# Patient Record
Sex: Female | Born: 1935 | Race: Black or African American | Hispanic: No | Smoking: Never smoker
Health system: Southern US, Community
[De-identification: ages and names within clinical notes are randomized; demographics above are authoritative.]

## PROBLEM LIST (undated history)

## (undated) DIAGNOSIS — M419 Scoliosis, unspecified: Secondary | ICD-10-CM

## (undated) DIAGNOSIS — E785 Hyperlipidemia, unspecified: Secondary | ICD-10-CM

## (undated) DIAGNOSIS — I10 Essential (primary) hypertension: Secondary | ICD-10-CM

## (undated) HISTORY — DX: Scoliosis, unspecified: M41.9

## (undated) HISTORY — DX: Hyperlipidemia, unspecified: E78.5

## (undated) HISTORY — DX: Essential (primary) hypertension: I10

---

## 2006-02-27 ENCOUNTER — Ambulatory Visit: Payer: Self-pay | Admitting: Pulmonary Disease

## 2006-03-06 ENCOUNTER — Ambulatory Visit: Payer: Self-pay | Admitting: Family Medicine

## 2006-03-20 ENCOUNTER — Ambulatory Visit: Payer: Self-pay | Admitting: Family Medicine

## 2006-05-15 ENCOUNTER — Emergency Department (HOSPITAL_COMMUNITY): Admission: EM | Admit: 2006-05-15 | Discharge: 2006-05-16 | Payer: Self-pay | Admitting: Emergency Medicine

## 2007-03-12 ENCOUNTER — Ambulatory Visit: Payer: Self-pay | Admitting: Family Medicine

## 2007-10-07 ENCOUNTER — Ambulatory Visit: Payer: Self-pay | Admitting: Family Medicine

## 2008-03-31 ENCOUNTER — Ambulatory Visit: Payer: Self-pay | Admitting: Family Medicine

## 2008-12-27 ENCOUNTER — Ambulatory Visit: Payer: Self-pay | Admitting: Family Medicine

## 2009-10-29 ENCOUNTER — Ambulatory Visit: Payer: Self-pay | Admitting: Oncology

## 2009-11-04 ENCOUNTER — Inpatient Hospital Stay (HOSPITAL_COMMUNITY): Admission: EM | Admit: 2009-11-04 | Discharge: 2009-11-05 | Payer: Self-pay | Admitting: Emergency Medicine

## 2009-11-06 ENCOUNTER — Ambulatory Visit: Payer: Self-pay | Admitting: Oncology

## 2009-11-08 LAB — CBC WITH DIFFERENTIAL/PLATELET
BASO%: 2.5 % — ABNORMAL HIGH (ref 0.0–2.0)
HCT: 29.2 % — ABNORMAL LOW (ref 34.8–46.6)
LYMPH%: 17.4 % (ref 14.0–49.7)
MCH: 36.4 pg — ABNORMAL HIGH (ref 25.1–34.0)
MCHC: 34.2 g/dL (ref 31.5–36.0)
MCV: 106.2 fL — ABNORMAL HIGH (ref 79.5–101.0)
MONO#: 0.7 10*3/uL (ref 0.1–0.9)
MONO%: 28.5 % — ABNORMAL HIGH (ref 0.0–14.0)
NEUT%: 51.2 % (ref 38.4–76.8)
Platelets: 74 10*3/uL — ABNORMAL LOW (ref 145–400)
RBC: 2.75 10*6/uL — ABNORMAL LOW (ref 3.70–5.45)
WBC: 2.4 10*3/uL — ABNORMAL LOW (ref 3.9–10.3)

## 2009-11-08 LAB — MORPHOLOGY: PLT EST: DECREASED

## 2009-11-09 ENCOUNTER — Ambulatory Visit: Payer: Self-pay | Admitting: Family Medicine

## 2009-11-09 LAB — ERYTHROPOIETIN: Erythropoietin: 11.5 m[IU]/mL (ref 2.6–34.0)

## 2009-11-22 ENCOUNTER — Ambulatory Visit: Payer: Self-pay | Admitting: Family Medicine

## 2009-12-06 ENCOUNTER — Ambulatory Visit: Payer: Self-pay | Admitting: Family Medicine

## 2009-12-17 ENCOUNTER — Ambulatory Visit: Payer: Self-pay | Admitting: Family Medicine

## 2010-01-07 ENCOUNTER — Ambulatory Visit: Payer: Self-pay | Admitting: Family Medicine

## 2010-01-29 ENCOUNTER — Ambulatory Visit: Payer: Self-pay | Admitting: Oncology

## 2010-01-31 LAB — CBC WITH DIFFERENTIAL/PLATELET
BASO%: 0.6 % (ref 0.0–2.0)
Basophils Absolute: 0 10*3/uL (ref 0.0–0.1)
HCT: 36.2 % (ref 34.8–46.6)
HGB: 12.3 g/dL (ref 11.6–15.9)
MONO#: 0.4 10*3/uL (ref 0.1–0.9)
NEUT#: 2.4 10*3/uL (ref 1.5–6.5)
NEUT%: 62.6 % (ref 38.4–76.8)
WBC: 3.8 10*3/uL — ABNORMAL LOW (ref 3.9–10.3)
lymph#: 0.9 10*3/uL (ref 0.9–3.3)

## 2010-02-26 ENCOUNTER — Ambulatory Visit: Payer: Self-pay | Admitting: Family Medicine

## 2010-03-14 ENCOUNTER — Ambulatory Visit: Payer: Self-pay | Admitting: Oncology

## 2010-04-16 ENCOUNTER — Ambulatory Visit: Payer: Self-pay | Admitting: Oncology

## 2010-04-18 LAB — COMPREHENSIVE METABOLIC PANEL
ALT: 16 U/L (ref 0–35)
AST: 21 U/L (ref 0–37)
Alkaline Phosphatase: 63 U/L (ref 39–117)
Calcium: 9.1 mg/dL (ref 8.4–10.5)
Chloride: 106 mEq/L (ref 96–112)
Creatinine, Ser: 0.69 mg/dL (ref 0.40–1.20)
Potassium: 4.4 mEq/L (ref 3.5–5.3)

## 2010-04-18 LAB — CBC WITH DIFFERENTIAL/PLATELET
BASO%: 0.2 % (ref 0.0–2.0)
Basophils Absolute: 0 10*3/uL (ref 0.0–0.1)
Eosinophils Absolute: 0.1 10*3/uL (ref 0.0–0.5)
HGB: 13.1 g/dL (ref 11.6–15.9)
MONO#: 0.5 10*3/uL (ref 0.1–0.9)
NEUT#: 2.6 10*3/uL (ref 1.5–6.5)
NEUT%: 60.9 % (ref 38.4–76.8)
RBC: 4.39 10*6/uL (ref 3.70–5.45)
RDW: 14.8 % — ABNORMAL HIGH (ref 11.2–14.5)
WBC: 4.3 10*3/uL (ref 3.9–10.3)
lymph#: 1 10*3/uL (ref 0.9–3.3)

## 2010-04-18 LAB — CHCC SMEAR

## 2010-08-06 ENCOUNTER — Ambulatory Visit (INDEPENDENT_AMBULATORY_CARE_PROVIDER_SITE_OTHER): Payer: Medicare Other | Admitting: Family Medicine

## 2010-08-06 DIAGNOSIS — B351 Tinea unguium: Secondary | ICD-10-CM

## 2010-08-22 ENCOUNTER — Ambulatory Visit (INDEPENDENT_AMBULATORY_CARE_PROVIDER_SITE_OTHER): Payer: Medicare Other | Admitting: Family Medicine

## 2010-08-22 DIAGNOSIS — L02619 Cutaneous abscess of unspecified foot: Secondary | ICD-10-CM

## 2010-08-22 DIAGNOSIS — L03039 Cellulitis of unspecified toe: Secondary | ICD-10-CM

## 2010-08-26 LAB — BASIC METABOLIC PANEL
BUN: 22 mg/dL (ref 6–23)
Calcium: 8.6 mg/dL (ref 8.4–10.5)
Chloride: 110 mEq/L (ref 96–112)
Creatinine, Ser: 0.8 mg/dL (ref 0.4–1.2)
GFR calc Af Amer: >60 mL/min (ref >60)

## 2010-08-26 LAB — FOLATE RBC: RBC Folate: 350 ng/mL (ref 180–600)

## 2010-08-26 LAB — PROTEIN ELECTROPH W RFLX QUANT IMMUNOGLOBULINS
Alpha-1-Globulin: 5 % — ABNORMAL HIGH (ref 2.9–4.9)
Alpha-2-Globulin: 7.5 % (ref 7.1–11.8)
Beta 2: 5.1 % (ref 3.2–6.5)
Gamma Globulin: 20.2 % — ABNORMAL HIGH (ref 11.1–18.8)

## 2010-08-26 LAB — COMPREHENSIVE METABOLIC PANEL
Albumin: 3.1 g/dL — ABNORMAL LOW (ref 3.5–5.2)
Alkaline Phosphatase: 63 U/L (ref 39–117)
BUN: 13 mg/dL (ref 6–23)
Calcium: 8 mg/dL — ABNORMAL LOW (ref 8.4–10.5)
Potassium: 3.5 mEq/L (ref 3.5–5.1)
Sodium: 140 mEq/L (ref 135–145)
Total Protein: 6 g/dL (ref 6.0–8.3)

## 2010-08-26 LAB — DIFFERENTIAL
Basophils Relative: 0 % (ref 0–1)
Eosinophils Relative: 0 % (ref 0–5)
Lymphocytes Relative: 33 % (ref 12–46)
Monocytes Relative: 3 % (ref 3–12)
Neutrophils Relative %: 64 % (ref 43–77)

## 2010-08-26 LAB — CULTURE, BLOOD (ROUTINE X 2)
Culture: NO GROWTH
Culture: NO GROWTH

## 2010-08-26 LAB — URINALYSIS, ROUTINE W REFLEX MICROSCOPIC
Bilirubin Urine: NEGATIVE
Hgb urine dipstick: NEGATIVE

## 2010-08-26 LAB — IMMUNOFIXATION ELECTROPHORESIS
IgG (Immunoglobin G), Serum: 1340 mg/dL (ref 694–1618)
IgM, Serum: 55 mg/dL — ABNORMAL LOW (ref 60–263)
Total Protein ELP: 5.7 g/dL — ABNORMAL LOW (ref 6.0–8.3)

## 2010-08-26 LAB — CROSSMATCH

## 2010-08-26 LAB — CBC
HCT: 19.6 % — ABNORMAL LOW (ref 36.0–46.0)
HCT: 30.8 % — ABNORMAL LOW (ref 36.0–46.0)
MCHC: 33.7 g/dL (ref 30.0–36.0)
MCV: 131.4 fL — ABNORMAL HIGH (ref 78.0–100.0)
Platelets: 108 10*3/uL — ABNORMAL LOW (ref 150–400)
Platelets: 81 10*3/uL — ABNORMAL LOW (ref 150–400)
RDW: 18.6 % — ABNORMAL HIGH (ref 11.5–15.5)
RDW: 30.6 % — ABNORMAL HIGH (ref 11.5–15.5)

## 2010-08-26 LAB — GLUCOSE, CAPILLARY: Glucose-Capillary: 108 mg/dL — ABNORMAL HIGH (ref 70–99)

## 2010-08-26 LAB — IRON AND TIBC
Saturation Ratios: 6 % — ABNORMAL LOW (ref 20–55)
TIBC: 186 ug/dL — ABNORMAL LOW (ref 250–470)

## 2010-08-26 LAB — IGG, IGA, IGM: IgG (Immunoglobin G), Serum: 1290 mg/dL (ref 694–1618)

## 2010-08-26 LAB — URINE CULTURE

## 2010-08-26 LAB — VITAMIN B12: Vitamin B-12: 100 pg/mL — ABNORMAL LOW (ref 211–911)

## 2010-08-26 LAB — IMMUNOFIXATION ADD-ON

## 2010-10-17 ENCOUNTER — Other Ambulatory Visit: Payer: Self-pay | Admitting: Oncology

## 2010-10-17 ENCOUNTER — Encounter (HOSPITAL_BASED_OUTPATIENT_CLINIC_OR_DEPARTMENT_OTHER): Payer: Medicare Other | Admitting: Oncology

## 2010-10-17 DIAGNOSIS — D61818 Other pancytopenia: Secondary | ICD-10-CM

## 2010-10-17 DIAGNOSIS — I10 Essential (primary) hypertension: Secondary | ICD-10-CM

## 2010-10-17 DIAGNOSIS — E538 Deficiency of other specified B group vitamins: Secondary | ICD-10-CM

## 2010-10-17 LAB — CBC WITH DIFFERENTIAL/PLATELET
Basophils Absolute: 0 10*3/uL (ref 0.0–0.1)
Eosinophils Absolute: 0.1 10*3/uL (ref 0.0–0.5)
HCT: 40.9 % (ref 34.8–46.6)
HGB: 13.8 g/dL (ref 11.6–15.9)
LYMPH%: 26.4 % (ref 14.0–49.7)
MCHC: 33.7 g/dL (ref 31.5–36.0)
MONO#: 0.4 10*3/uL (ref 0.1–0.9)
NEUT%: 62.4 % (ref 38.4–76.8)
Platelets: 174 10*3/uL (ref 145–400)
WBC: 4 10*3/uL (ref 3.9–10.3)
lymph#: 1.1 10*3/uL (ref 0.9–3.3)

## 2010-10-21 LAB — COMPREHENSIVE METABOLIC PANEL
BUN: 20 mg/dL (ref 6–23)
CO2: 22 mEq/L (ref 19–32)
Calcium: 9.1 mg/dL (ref 8.4–10.5)
Chloride: 105 mEq/L (ref 96–112)
Creatinine, Ser: 0.57 mg/dL (ref 0.40–1.20)
Glucose, Bld: 89 mg/dL (ref 70–99)
Total Bilirubin: 0.4 mg/dL (ref 0.3–1.2)

## 2010-10-21 LAB — VITAMIN B12: Vitamin B-12: 1004 pg/mL — ABNORMAL HIGH (ref 211–911)

## 2010-10-21 LAB — METHYLMALONIC ACID, SERUM: Methylmalonic Acid, Quantitative: 275 nmol/L (ref 87–318)

## 2010-10-25 NOTE — Assessment & Plan Note (Signed)
[facility]                               PULMONARY OFFICE NOTE   MALILLANY, KAZLAUSKAS                            MRN:          [id number]  DATE:02/26/2006                            DOB:          1915/10/19    HISTORY OF PRESENT ILLNESS:  Patient is a very pleasant 77 year old black  female who is being referred for an abnormal chest x-ray. Patient recently  had an increase in cough and went to go see Dr. Arthor Captain, where a chest a x-  ray revealed chronic changes/infiltration in the left upper lobe. Because of  this, the patient was referred for further evaluation. Patient states that  her cough is dry in nature and she has had no congestion or rattling and no  mucus. She has had no recent shortness of breath, fevers, chills or sweats.  She has had no weight loss and she has been eating quite well according to  the granddaughter. Patient does have a history of tuberculosis, diagnosed in  52 while living in [place]. Apparently, she took medications for a very  long period of time according to the granddaughter and was followed by the  health department. I will try to obtain these records. Patient states that  she does have a lot of rhinorrhea and post-nasal drip but she denies any  Gastroesophageal Reflux Disease symptoms. Patient has never smoked in the  past.   PAST MEDICAL HISTORY:  Significant for hypertension and history of  tuberculosis as stated above.   CURRENT MEDICATIONS:  Nifedical XL 30 mg daily.   ALLERGIES:  Patient has no known drug allergies.   SOCIAL HISTORY:  The patient is widowed and has children. She has never  smoked. She lives with her granddaughter, Oliva Bustard.   FAMILY HISTORY:  Remarkable for heart disease and cancer.   REVIEW OF SYSTEMS:  As per history of present illness. Also see the  patient's intake form documented on chart.   PHYSICAL EXAMINATION:  GENERAL: She is a well-developed, black female in no  acute  distress.  Blood pressure is 120/70, pulse 60, temperature is 97.4, weight 138 pounds,  02 saturation on room air is questionably 89%.  HEENT: Pupils equal, round, and reactive to light and accommodation.  Extraocular muscles are intact. Nares patent without discharge, oropharynx  clear.  NECK: Supple, without jugular vein distention or lymphadenopathy. No  palpable thyromegaly.  CHEST: Totally clear.  CARDIAC: Regular rate and rhythm with a 1/6 systolic murmur.  ABDOMEN: Soft, non-tender with good bowel sounds.  Genitalia exam, rectal exam, and breast exam was not done, and  not  indicated.  LOWER EXTREMITIES: Are without edema, good pulses distally.  NEUROLOGIC: She is alert and oriented and has no obvious, observable motor  defects.   LABORATORY DATA:  Chest x-ray from February 06, 2006, as per history of  present illness.   IMPRESSION:  Left upper lobe scarring and an infiltrate that is most  consistent with old tuberculosis. The patient has been treated for active  tuberculosis in 1995, and currently has absolutely no  symptoms consistent  with an active tuberculosis infection. I suspect this is old scarring. There  is no evidence to suggest an active bacterial infection either. The patient  has no constitutional symptoms and seems to be thriving, according to the  granddaughter. I have the told the patient and her granddaughter that I  cannot 100% exclude the possibility of malignancy but I think it is very  unlikely given her non-smoking history. Even if this did represent a  possible malignancy, there is really very little that I would I do at this  point in a frail 77 year old woman. There are agreeable with this.   PLAN:  1. Obtain records from [place] and the health department to make sure that      she was adequately treated for her tuberculosis.  2. The patient is to call me should she develop worsening constitutional      symptoms or other problems consistent with an  active infection.                                   Barbaraann Share, MD,FCCP   [facility]  DD:  02/27/2006  DT:  03/02/2006  Job #:  [id number]   cc:   Dr. Jannette Spanner

## 2010-11-29 ENCOUNTER — Encounter: Payer: Self-pay | Admitting: Medical

## 2010-11-29 ENCOUNTER — Telehealth: Payer: Self-pay | Admitting: Medical

## 2010-11-29 ENCOUNTER — Ambulatory Visit (INDEPENDENT_AMBULATORY_CARE_PROVIDER_SITE_OTHER): Payer: Medicare Other | Admitting: Medical

## 2010-11-29 ENCOUNTER — Ambulatory Visit
Admission: RE | Admit: 2010-11-29 | Discharge: 2010-11-29 | Disposition: A | Discharge status: Home / Self Care | Payer: Medicare Other | Source: Ambulatory Visit | Attending: Medical | Admitting: Medical

## 2010-11-29 VITALS — BP 140/72 | HR 80 | Ht 68.0 in | Wt 113.0 lb

## 2010-11-29 DIAGNOSIS — M76899 Other specified enthesopathies of unspecified lower limb, excluding foot: Secondary | ICD-10-CM

## 2010-11-29 DIAGNOSIS — M7071 Other bursitis of hip, right hip: Secondary | ICD-10-CM

## 2010-11-29 DIAGNOSIS — M25559 Pain in unspecified hip: Secondary | ICD-10-CM

## 2010-11-29 DIAGNOSIS — M25551 Pain in right hip: Secondary | ICD-10-CM

## 2010-11-29 IMAGING — CR DG HIP COMPLETE 2+V*R*
2 series · 2 of 2 positions shown · non-contrast
Comparison: None.

CLINICAL DATA: Right hip pain without trauma history.

RIGHT HIP - COMPLETE 2+ VIEW

[t hip ap right]
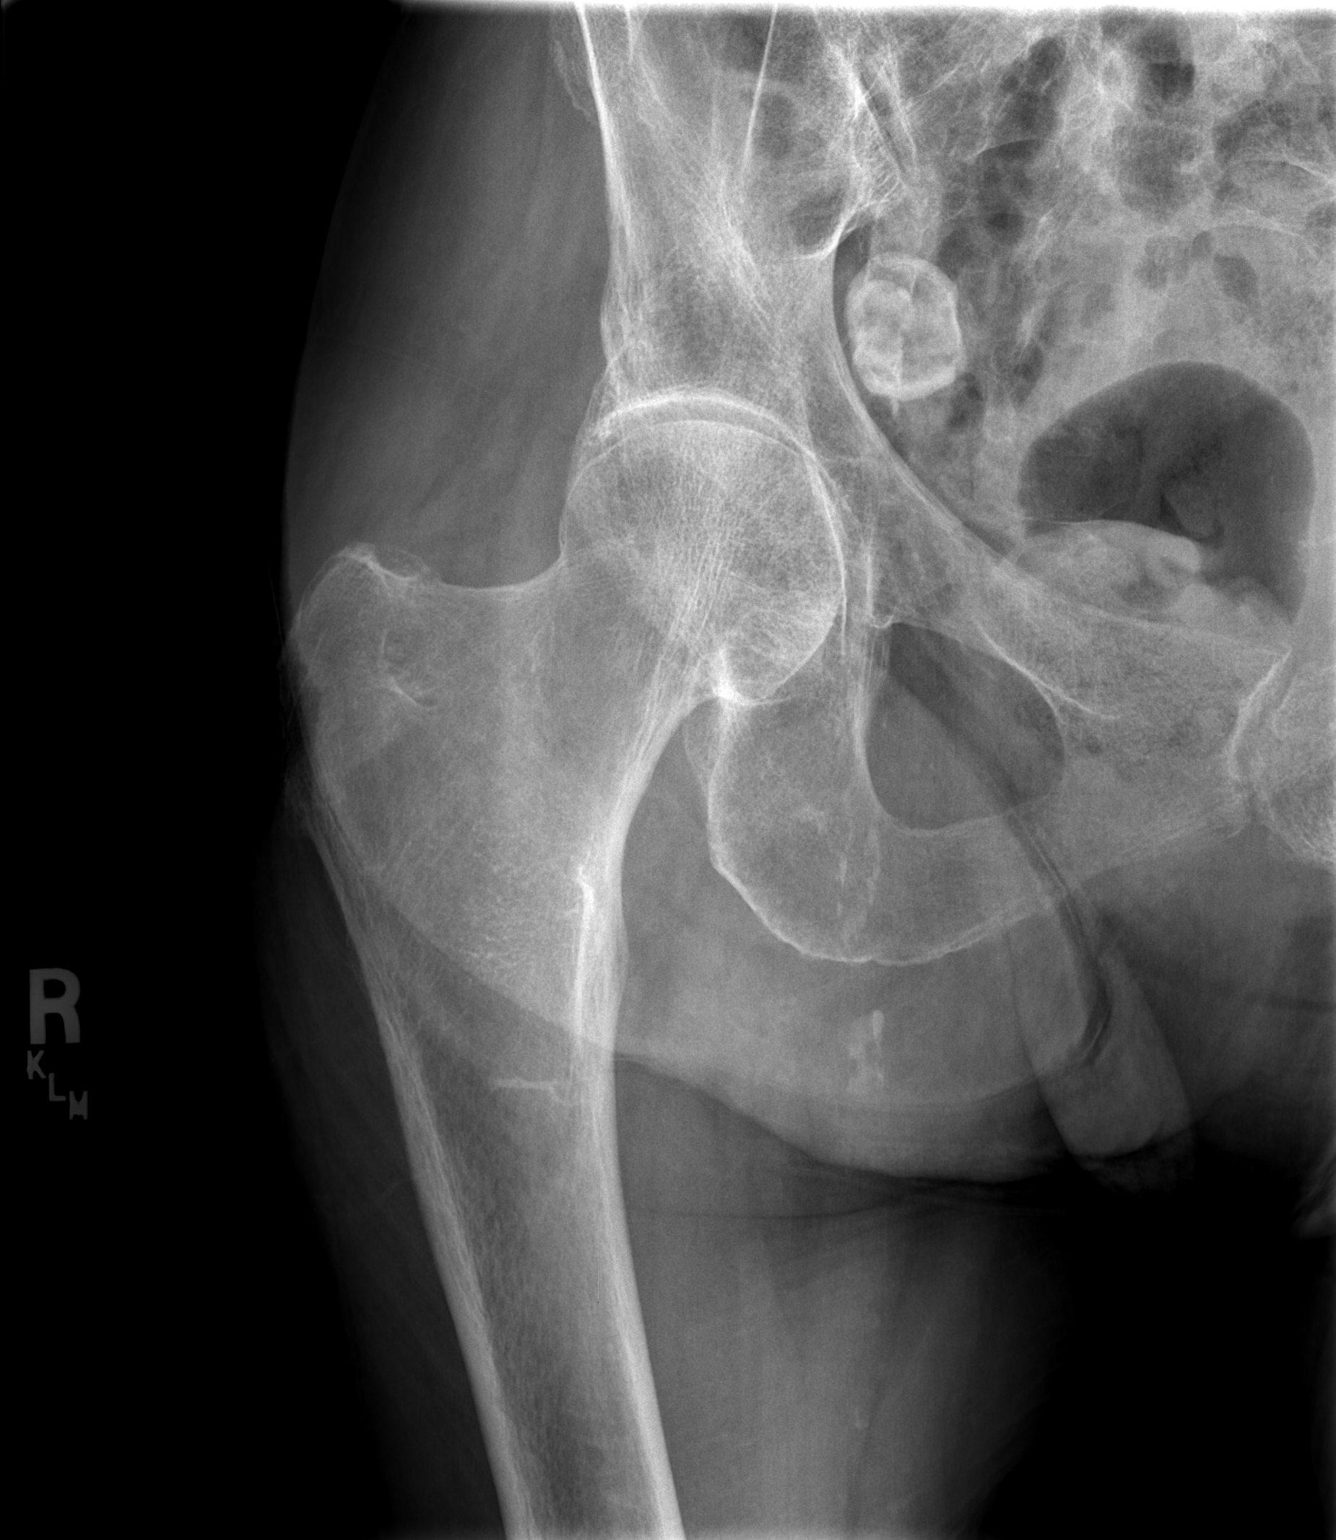

[t hip frog leg right]
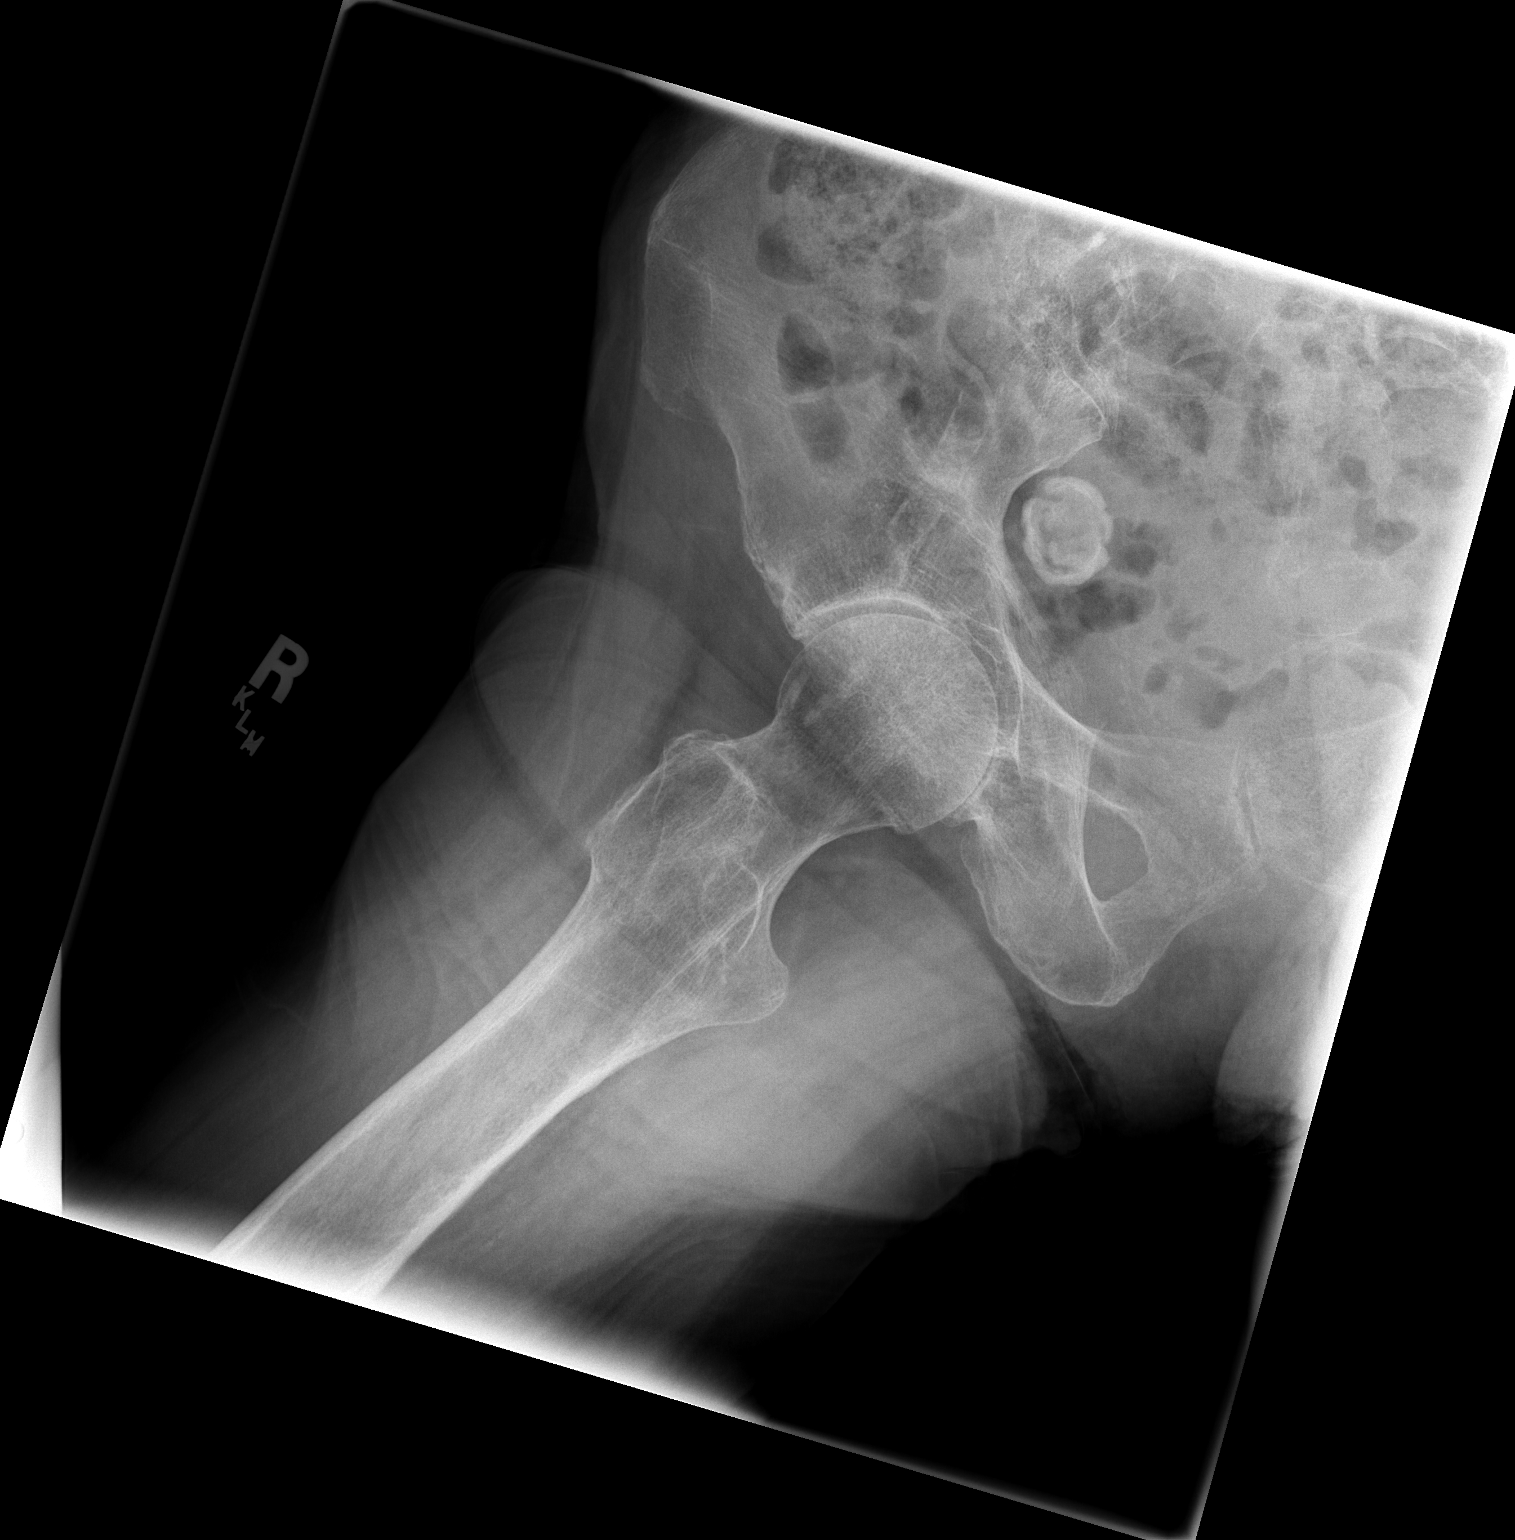

[2 of 2 positions shown; findings below may reference images not displayed]

FINDINGS: Diffuse osteopenia with no acute fracture, subluxation,
dislocation, osteonecrosis or stress fracture.

Marked degenerative joint space narrowing seen primarily medially
and inferiorly.

Slight to moderate peripheral arterial calcification with
approximate 3 cm right pelvic probable calcified uterine fibroid.
IMPRESSION: 1.  No inflammatory or acute abnormality.
2.  Diffuse osteopenia.
3.  Marked degenerative joint space narrowing at inferomedial right
hip with no complicating features.
4.  Slight to moderate peripheral arterial calcification with 3 cm
right pelvic probable calcified uterine fibroid.

## 2010-11-29 NOTE — Patient Instructions (Signed)
Recommended over-the-counter Tylenol arthritis 650 mg every 6 hours or 2 Aleve twice a day for short period time, ice, rest. We will call with x-ray results.

## 2010-11-29 NOTE — Telephone Encounter (Signed)
Advised grand dtr Selena Batten of pt's X-ray results as per Vincenza Hews & scheduled 2 week follow up-lm

## 2010-11-29 NOTE — Progress Notes (Signed)
Subjective:    Ashley Fisher is a 77 y.o. female who presents with right hip pain. Onset of the symptoms was several days ago. Inciting event: none. The patient reports the hip pain is worse with weight bearing, is aggravated by walking and Worse getting out of a chair. Aggravating symptoms include: going up and down stairs. Patient has had no prior hip problems. Previous visits for this problem: none. Evaluation to date: none. Treatment to date: none.  She did see her hematologist within the past month and her anemia is stable. No other new complaints, otherwise in her normal state of health. She lives with her granddaughter and her family and is supervised. She denies any fall or trauma.  The following portions of the patient's history were reviewed and updated as appropriate: allergies, current medications, past family history, past medical history, past social history, past surgical history and problem list.   Past Medical History  Diagnosis Date  . Hypertension   . Osteoporosis   . Scoliosis   . Dyslipidemia   . Vitamin D deficiency     Review of Systems A comprehensive review of systems was negative.   Objective:    BP 140/72  Pulse 80  Ht 5\' 8"  (1.727 m)  Wt 113 lb (51.256 kg)  BMI 17.18 kg/m2 General: No acute distress, African American female, tall and thin Skin: No erythema, no ecchymosis, no warmth, normal in the hip regions Neuro: Lower extremities normal strength and sensation, gait is slow but otherwise unremarkable Musculoskeletal: Tender over right greater trochanteric bursa, pain with external right hip range of motion, muscle mass in general is decreased throughout UE and LE Right hip: positives: pain with rotation and tenderness over greater trochanter  Left hip: full painless range of motion, without tenderness    Assessment:   Encounter Diagnoses  Name Primary?  . Hip pain, right Yes  . Bursitis of hip, right      Plan:   Etiology most likely right hip  bursitis.  We will send for x-ray.  Patient Instructions  Recommended over-the-counter Tylenol arthritis 650 mg every 6 hours or 2 Aleve twice a day for short period time, ice, rest. We will call with x-ray results.

## 2010-12-02 ENCOUNTER — Encounter: Payer: Self-pay | Admitting: Family Medicine

## 2010-12-02 DIAGNOSIS — M419 Scoliosis, unspecified: Secondary | ICD-10-CM | POA: Insufficient documentation

## 2010-12-02 DIAGNOSIS — I10 Essential (primary) hypertension: Secondary | ICD-10-CM | POA: Insufficient documentation

## 2010-12-02 DIAGNOSIS — M810 Age-related osteoporosis without current pathological fracture: Secondary | ICD-10-CM | POA: Insufficient documentation

## 2010-12-02 DIAGNOSIS — E538 Deficiency of other specified B group vitamins: Secondary | ICD-10-CM

## 2010-12-02 DIAGNOSIS — E785 Hyperlipidemia, unspecified: Secondary | ICD-10-CM | POA: Insufficient documentation

## 2010-12-12 ENCOUNTER — Ambulatory Visit: Payer: Medicare Other | Admitting: Medical

## 2011-02-09 ENCOUNTER — Other Ambulatory Visit: Payer: Self-pay | Admitting: Family Medicine

## 2011-02-12 ENCOUNTER — Other Ambulatory Visit: Payer: Self-pay | Admitting: Family Medicine

## 2011-03-14 ENCOUNTER — Other Ambulatory Visit: Payer: Self-pay | Admitting: Oncology

## 2011-03-14 ENCOUNTER — Encounter (HOSPITAL_BASED_OUTPATIENT_CLINIC_OR_DEPARTMENT_OTHER): Payer: Medicare Other | Admitting: Oncology

## 2011-03-14 DIAGNOSIS — E538 Deficiency of other specified B group vitamins: Secondary | ICD-10-CM

## 2011-03-14 DIAGNOSIS — Z23 Encounter for immunization: Secondary | ICD-10-CM

## 2011-03-14 DIAGNOSIS — D61818 Other pancytopenia: Secondary | ICD-10-CM

## 2011-03-14 LAB — CBC WITH DIFFERENTIAL/PLATELET
Basophils Absolute: 0 10*3/uL (ref 0.0–0.1)
EOS%: 0.8 % (ref 0.0–7.0)
Eosinophils Absolute: 0 10*3/uL (ref 0.0–0.5)
HGB: 12.8 g/dL (ref 11.6–15.9)
MCV: 89 fL (ref 79.5–101.0)
MONO%: 11.8 % (ref 0.0–14.0)
NEUT#: 2.6 10*3/uL (ref 1.5–6.5)
RBC: 4.2 10*6/uL (ref 3.70–5.45)
RDW: 14.9 % — ABNORMAL HIGH (ref 11.2–14.5)
lymph#: 0.9 10*3/uL (ref 0.9–3.3)

## 2011-03-14 LAB — COMPREHENSIVE METABOLIC PANEL
AST: 21 U/L (ref 0–37)
Alkaline Phosphatase: 66 U/L (ref 39–117)
BUN: 20 mg/dL (ref 6–23)
Chloride: 107 mEq/L (ref 96–112)
Glucose, Bld: 109 mg/dL — ABNORMAL HIGH (ref 70–99)
Potassium: 4.2 mEq/L (ref 3.5–5.3)
Sodium: 141 mEq/L (ref 135–145)
Total Protein: 6.9 g/dL (ref 6.0–8.3)

## 2011-03-14 LAB — VITAMIN B12: Vitamin B-12: >2000 pg/mL — ABNORMAL HIGH (ref 211–911)

## 2011-05-07 ENCOUNTER — Encounter: Payer: Self-pay | Admitting: Family Medicine

## 2011-05-07 ENCOUNTER — Ambulatory Visit (INDEPENDENT_AMBULATORY_CARE_PROVIDER_SITE_OTHER): Payer: Medicare Other | Admitting: Family Medicine

## 2011-05-07 VITALS — BP 120/74 | HR 84

## 2011-05-07 DIAGNOSIS — S9032XA Contusion of left foot, initial encounter: Secondary | ICD-10-CM

## 2011-05-07 DIAGNOSIS — S9030XA Contusion of unspecified foot, initial encounter: Secondary | ICD-10-CM

## 2011-05-07 NOTE — Progress Notes (Signed)
  Subjective:    Patient ID: Ashley Fisher, female    DOB: [birth year], 77 y.o.   MRN: [id number]  HPI She was found earlier today on the side of her bed. She has no particular complaints other than she has been noted to be limping slightly and unwilling to put weight on her left side.  Review of Systems     Objective:   Physical Exam Alert and in no distress. Good motion of the hip with no tenderness to palpation of the back, hip, femur, knee or tibia. She does have minimal swelling over the dorsum of the foot. X-ray shows no fracture but definite osteoporosis.      Assessment & Plan:  Foot contusion. Osteoporosis .Supportive care and call if further difficulty.

## 2011-12-12 ENCOUNTER — Ambulatory Visit (INDEPENDENT_AMBULATORY_CARE_PROVIDER_SITE_OTHER): Payer: Medicare Other | Admitting: Medical

## 2011-12-12 ENCOUNTER — Encounter: Payer: Self-pay | Admitting: Medical

## 2011-12-12 VITALS — BP 158/80 | HR 84 | Temp 98.2°F | Resp 16

## 2011-12-12 DIAGNOSIS — L84 Corns and callosities: Secondary | ICD-10-CM

## 2011-12-12 DIAGNOSIS — G478 Other sleep disorders: Secondary | ICD-10-CM

## 2011-12-12 DIAGNOSIS — F513 Sleepwalking [somnambulism]: Secondary | ICD-10-CM

## 2011-12-12 NOTE — Progress Notes (Signed)
Subjective:   HPI  Ashley Fisher is a 77 y.o. female who presents for sleepwalking concerns.  Here with granddaughter who is her caretaker.   Her granddaughter c/o that Ashley Fisher is sleepwalking.  Her granddaughter keeps a monitor in her room, and when she starts loud talking, she will go in and check on Ashley Fisher.  She has sleep walked 3-4 times in the past, but twice this week and worries that her sleep walking is increasing.  Both times this week she had gotten up sleepwalking out of the bed.  This time she had pulled the lamp out, was in the closet, and was talking about getting biscuits.   When granddaughter checks on AshleyFisher, her eyes are open and she says she is awake, but it takes some time to get Ashley Fisher back in the bed and to calm down.  She is strong and physical difficult to get back in the bed.  Sometimes she gets up to use the bathroom, but the sleep walking is occasional.  Granddaughter says her room is safe and she can't get out of her room, but she just wondered why this is happening.  She has a sleep routine and this is regular and no recent deviations to her bedtime or wake time.  She denies any recent illness, no URI, GI, or GU symptoms, no hallucinations, but she does report seeing other loved ones who have passed on, sees them at time.  Denies evening anxiety, fear.  Granddaughter says there is "nothing wrong with her mind."  Says she is a physical strong and mentally sharp 78yo. No other aggravating or relieving factors.    No other c/o.  The following portions of the patient's history were reviewed and updated as appropriate: allergies, current medications, past family history, past medical history, past social history, past surgical history and problem list.  Past Medical History  Diagnosis Date  . Hypertension   . Osteoporosis   . Scoliosis   . Dyslipidemia   . Vitamin d deficiency     No Known Allergies   Review of Systems ROS reviewed and was negative other than  noted in HPI or above.    Objective:   Physical Exam  General appearance: alert, no distress, WD/WN Oral cavity: MMM, no lesions Neck: supple, no lymphadenopathy, no thyromegaly, no masses Heart: RRR, normal S1, S2, no murmurs Lungs: CTA bilaterally, no wheezes, rhonchi, or rales Abdomen: +bs, soft, non tender, non distended, no masses, no hepatomegaly, no splenomegaly Pulses: 2+ symmetric Skin: left 4th toe with distal callous somewhat tender, lateral PIP of 4th toe with callous, otherwise non tender, several toenails thickened, but no obvious sores or ulcers Psych: pleasant, answers questions appropriately  Assessment and Plan :     Encounter Diagnoses  Name Primary?  . Sleep walking Yes  . Corns/callosities    Discussed concerns about sleep walking.  Discussed possible causes/reasons for this to be occurring.  Discussed patient safety in the home.  Discussed option of sleep study if things worsen or get more frequent.  Discussed signs/symptoms of dementia.  She doesn't seem to have anxiety issues, no depression currently, doing well in general.  Discussed sleep hygiene, discussed safety, fall prevention, discussed trial of children or low dose adult benadryl at bedtime for the next 1-2 wk to see if this helps.  Discussed other prescription medication, but there are risks to these medications in elderly such as fall risk and sedation.  If this continues to be a  problem or become more frequent, call or return.   Corns/callosities - epsom salt soaks, pumice stone and if not improving, let us know.  Can use donut pads for shoes.

## 2012-03-03 ENCOUNTER — Other Ambulatory Visit: Payer: Self-pay | Admitting: Family Medicine

## 2012-03-11 ENCOUNTER — Encounter: Payer: Medicare Other | Admitting: Family Medicine

## 2012-03-12 ENCOUNTER — Telehealth: Payer: Self-pay | Admitting: Family Medicine

## 2012-03-12 MED ORDER — NIFEDIPINE ER OSMOTIC RELEASE 30 MG PO TB24: 30.0000 mg | ORAL_TABLET | Freq: Every day | ORAL | Status: DC

## 2012-03-12 NOTE — Telephone Encounter (Signed)
Sent med in for 1 mth

## 2012-04-02 ENCOUNTER — Encounter: Payer: Self-pay | Admitting: Internal Medicine

## 2012-04-12 ENCOUNTER — Encounter: Payer: Medicare Other | Admitting: Family Medicine

## 2012-04-13 ENCOUNTER — Encounter: Payer: Self-pay | Admitting: Family Medicine

## 2012-04-13 ENCOUNTER — Ambulatory Visit (INDEPENDENT_AMBULATORY_CARE_PROVIDER_SITE_OTHER): Payer: Medicare Other | Admitting: Family Medicine

## 2012-04-13 VITALS — BP 130/82 | HR 68

## 2012-04-13 DIAGNOSIS — I10 Essential (primary) hypertension: Secondary | ICD-10-CM

## 2012-04-13 DIAGNOSIS — E538 Deficiency of other specified B group vitamins: Secondary | ICD-10-CM

## 2012-04-13 DIAGNOSIS — Z23 Encounter for immunization: Secondary | ICD-10-CM

## 2012-04-13 DIAGNOSIS — Z79899 Other long term (current) drug therapy: Secondary | ICD-10-CM

## 2012-04-13 MED ORDER — NIFEDIPINE ER OSMOTIC RELEASE 30 MG PO TB24: 30.0000 mg | ORAL_TABLET | Freq: Every day | ORAL | Status: DC

## 2012-04-13 MED ORDER — INFLUENZA VIRUS VACC SPLIT PF IM SUSP: 0.5000 mL | Freq: Once | INTRAMUSCULAR | Status: DC

## 2012-04-13 NOTE — Progress Notes (Signed)
  Subjective:    Patient ID: Ashley Fisher, female    DOB: 09-Aug-1915, 78 y.o.   MRN: [id number]  HPI She is here for a medication recheck. She has no particular concerns or complaints. She has been off vitamin B12 injections for approximately one year.   Review of Systems     Objective:   Physical Exam Alert and in no distress. Cardiac exam shows a grade 1 murmur. Lungs are clear to auscultation. Abdominal exam shows no masses.       Assessment & Plan:   1. HTN (hypertension)  Vitamin B12, NIFEdipine (NIFEDICAL XL) 30 MG 24 hr tablet  2. Vitamin B12 deficiency  CBC with Differential  3. Encounter for long-term (current) use of other medications  CBC with Differential, Vitamin B12  4. Need for prophylactic vaccination and inoculation against influenza  influenza  inactive virus vaccine (FLUZONE/FLUARIX) injection 0.5 mL   flu shot given with discussion of risks and benefits.

## 2012-04-14 LAB — CBC WITH DIFFERENTIAL/PLATELET
HCT: 37.2 % (ref 36.0–46.0)
Hemoglobin: 12.8 g/dL (ref 12.0–15.0)
Lymphocytes Relative: 34 % (ref 12–46)
Lymphs Abs: 1.5 10*3/uL (ref 0.7–4.0)
MCHC: 34.4 g/dL (ref 30.0–36.0)
Monocytes Absolute: 0.8 10*3/uL (ref 0.1–1.0)
Monocytes Relative: 17 % — ABNORMAL HIGH (ref 3–12)
Neutro Abs: 2 10*3/uL (ref 1.7–7.7)
RBC: 4.38 MIL/uL (ref 3.87–5.11)
WBC: 4.3 10*3/uL (ref 4.0–10.5)

## 2012-08-22 ENCOUNTER — Emergency Department (HOSPITAL_COMMUNITY)
Admission: EM | Admit: 2012-08-22 | Discharge: 2012-08-22 | Disposition: A | Discharge status: Home / Self Care | Payer: Medicare Other | Attending: Emergency Medicine | Admitting: Emergency Medicine

## 2012-08-22 ENCOUNTER — Encounter (HOSPITAL_COMMUNITY): Payer: Self-pay | Admitting: Emergency Medicine

## 2012-08-22 ENCOUNTER — Emergency Department (HOSPITAL_COMMUNITY): Payer: Medicare Other

## 2012-08-22 DIAGNOSIS — Z862 Personal history of diseases of the blood and blood-forming organs and certain disorders involving the immune mechanism: Secondary | ICD-10-CM | POA: Insufficient documentation

## 2012-08-22 DIAGNOSIS — M2550 Pain in unspecified joint: Secondary | ICD-10-CM | POA: Insufficient documentation

## 2012-08-22 DIAGNOSIS — IMO0001 Reserved for inherently not codable concepts without codable children: Secondary | ICD-10-CM | POA: Insufficient documentation

## 2012-08-22 DIAGNOSIS — R531 Weakness: Secondary | ICD-10-CM

## 2012-08-22 DIAGNOSIS — N390 Urinary tract infection, site not specified: Secondary | ICD-10-CM | POA: Insufficient documentation

## 2012-08-22 DIAGNOSIS — R011 Cardiac murmur, unspecified: Secondary | ICD-10-CM | POA: Insufficient documentation

## 2012-08-22 DIAGNOSIS — I10 Essential (primary) hypertension: Secondary | ICD-10-CM | POA: Insufficient documentation

## 2012-08-22 DIAGNOSIS — M4000 Postural kyphosis, site unspecified: Secondary | ICD-10-CM | POA: Insufficient documentation

## 2012-08-22 DIAGNOSIS — Z8639 Personal history of other endocrine, nutritional and metabolic disease: Secondary | ICD-10-CM | POA: Insufficient documentation

## 2012-08-22 DIAGNOSIS — R9431 Abnormal electrocardiogram [ECG] [EKG]: Secondary | ICD-10-CM | POA: Insufficient documentation

## 2012-08-22 DIAGNOSIS — Z8739 Personal history of other diseases of the musculoskeletal system and connective tissue: Secondary | ICD-10-CM | POA: Insufficient documentation

## 2012-08-22 DIAGNOSIS — R5381 Other malaise: Secondary | ICD-10-CM | POA: Insufficient documentation

## 2012-08-22 LAB — CBC WITH DIFFERENTIAL/PLATELET
Eosinophils Absolute: 0 10*3/uL (ref 0.0–0.7)
Eosinophils Relative: 0 % (ref 0–5)
Hemoglobin: 13.7 g/dL (ref 12.0–15.0)
Lymphocytes Relative: 11 % — ABNORMAL LOW (ref 12–46)
Lymphs Abs: 0.7 10*3/uL (ref 0.7–4.0)
MCH: 29.2 pg (ref 26.0–34.0)
MCV: 86.4 fL (ref 78.0–100.0)
Monocytes Relative: 8 % (ref 3–12)
Platelets: 211 10*3/uL (ref 150–400)
RBC: 4.69 MIL/uL (ref 3.87–5.11)
WBC: 6.1 10*3/uL (ref 4.0–10.5)

## 2012-08-22 LAB — COMPREHENSIVE METABOLIC PANEL
ALT: 12 U/L (ref 0–35)
Alkaline Phosphatase: 63 U/L (ref 39–117)
BUN: 29 mg/dL — ABNORMAL HIGH (ref 6–23)
CO2: 22 mEq/L (ref 19–32)
Calcium: 9 mg/dL (ref 8.4–10.5)
GFR calc Af Amer: 47 mL/min — ABNORMAL LOW (ref >90)
GFR calc non Af Amer: 41 mL/min — ABNORMAL LOW (ref >90)
Glucose, Bld: 184 mg/dL — ABNORMAL HIGH (ref 70–99)
Potassium: 3.5 mEq/L (ref 3.5–5.1)
Sodium: 137 mEq/L (ref 135–145)
Total Protein: 6.3 g/dL (ref 6.0–8.3)

## 2012-08-22 LAB — URINE MICROSCOPIC-ADD ON

## 2012-08-22 LAB — URINALYSIS, ROUTINE W REFLEX MICROSCOPIC
Bilirubin Urine: NEGATIVE
Ketones, ur: 15 mg/dL — AB
Nitrite: NEGATIVE
Protein, ur: 30 mg/dL — AB
Urobilinogen, UA: 0.2 mg/dL (ref 0.0–1.0)

## 2012-08-22 MED ORDER — SODIUM CHLORIDE 0.9 % IV SOLN
Freq: Once | INTRAVENOUS | Status: AC
  Administered 2012-08-22: 13:00:00 via INTRAVENOUS

## 2012-08-22 MED ORDER — SULFAMETHOXAZOLE-TRIMETHOPRIM 800-160 MG PO TABS: 1.0000 | ORAL_TABLET | Freq: Two times a day (BID) | ORAL | Status: DC

## 2012-08-22 NOTE — ED Notes (Signed)
Pt presenting to ed with c/o per granddaughter pt is more weak and sleeping a lot. Per granddaughter pt has the same symptoms as she did when her blood was low. Per granddaughter pt is very weak and congested.

## 2012-08-22 NOTE — ED Provider Notes (Signed)
History     CSN: [id number]  Arrival date & time 08/22/12  1115   First MD Initiated Contact with Patient 08/22/12 1158      Chief Complaint  Patient presents with  . Weakness    (Consider location/radiation/quality/duration/timing/severity/associated sxs/prior treatment) HPI Comments: Patient's granddaughter reports increasing generalized weakness for the past 2 weeks. She's been sleeping later in the day and not as mobile as usual. Denies any cough or fever. He denies any abdominal pain, nausea or vomiting. Granddaughter states she's had anemia in the past and she is worried she has again. No history of blood in the stool or vomiting blood. Patient is oriented x2 and denies complaint. Her only medication is nifedipine and her only medical problem is hypertension.  The history is provided by the patient and a relative. The history is limited by the condition of the patient.    Past Medical History  Diagnosis Date  . Hypertension   . Osteoporosis   . Scoliosis   . Dyslipidemia   . Vitamin D deficiency     History reviewed. No pertinent past surgical history.  Family History  Problem Relation Age of Onset  . Stroke Mother   . Cancer Sister     History  Substance Use Topics  . Smoking status: Never Smoker   . Smokeless tobacco: Never Used  . Alcohol Use: No    OB History   Grav Para Term Preterm Abortions TAB SAB Ect Mult Living                  Review of Systems  Constitutional: Positive for activity change, appetite change and fatigue. Negative for fever.  HENT: Negative for congestion and rhinorrhea.   Eyes: Negative for visual disturbance.  Respiratory: Negative for cough, chest tightness and shortness of breath.   Cardiovascular: Negative for chest pain.  Gastrointestinal: Negative for nausea, vomiting and abdominal pain.  Genitourinary: Negative for dysuria and hematuria.  Musculoskeletal: Positive for myalgias and arthralgias. Negative for back pain.   Neurological: Positive for weakness. Negative for dizziness, light-headedness and headaches.  A complete 10 system review of systems was obtained and all systems are negative except as noted in the HPI and PMH.    Allergies  Review of patient's allergies indicates no known allergies.  Home Medications   Current Outpatient Rx  Name  Route  Sig  Dispense  Refill  . NIFEdipine (NIFEDICAL XL) 30 MG 24 hr tablet   Oral   Take 1 tablet (30 mg total) by mouth daily.   90 tablet   3   . sulfamethoxazole-trimethoprim (SEPTRA DS) 800-160 MG per tablet   Oral   Take 1 tablet by mouth every 12 (twelve) hours.   14 tablet   0     BP 133/66  Pulse 89  Temp(Src) 97.3 F (36.3 C) (Oral)  Resp 16  SpO2 95%  Physical Exam  Constitutional: She appears well-developed and well-nourished.  HENT:  Head: Normocephalic and atraumatic.  Mouth/Throat: Oropharynx is clear and moist. No oropharyngeal exudate.  Eyes: Conjunctivae and EOM are normal. Pupils are equal, round, and reactive to light.  Neck: Normal range of motion. Neck supple.  Cardiovascular: Normal rate and regular rhythm.   Murmur heard. Irregular rhythm with loud precordial murmur  Pulmonary/Chest: Effort normal and breath sounds normal. No respiratory distress.  Abdominal: Soft. There is no tenderness. There is no rebound and no guarding.  Musculoskeletal: Normal range of motion. She exhibits no edema and no tenderness.  Kyphosis  Neurological: No cranial nerve deficit. She exhibits normal muscle tone. Coordination normal.  Skin: Skin is warm.    ED Course  Procedures (including critical care time)  Labs Reviewed  CBC WITH DIFFERENTIAL - Abnormal; Notable for the following:    Neutrophils Relative 81 (*)    Lymphocytes Relative 11 (*)    All other components within normal limits  COMPREHENSIVE METABOLIC PANEL - Abnormal; Notable for the following:    Glucose, Bld 184 (*)    BUN 29 (*)    Creatinine, Ser 1.12 (*)     Albumin 3.0 (*)    GFR calc non Af Amer 41 (*)    GFR calc Af Amer 47 (*)    All other components within normal limits  URINALYSIS, ROUTINE W REFLEX MICROSCOPIC - Abnormal; Notable for the following:    APPearance TURBID (*)    Hgb urine dipstick SMALL (*)    Ketones, ur 15 (*)    Protein, ur 30 (*)    Leukocytes, UA MODERATE (*)    All other components within normal limits  PROTIME-INR - Abnormal; Notable for the following:    Prothrombin Time 15.5 (*)    All other components within normal limits  URINE MICROSCOPIC-ADD ON - Abnormal; Notable for the following:    Bacteria, UA MANY (*)    All other components within normal limits  URINE CULTURE  TROPONIN I  OCCULT BLOOD, POC DEVICE   Dg Chest 2 View  08/22/2012  *RADIOLOGY REPORT*  Clinical Data: Shortness of breath and weakness.  CHEST - 2 VIEW  Comparison: 11/04/2009  Findings: Upper limits normal heart size noted.  Bilateral upper lung scarring again noted. Tiny bilateral pleural effusions are noted. There is no definite airspace disease, pneumothorax or new pulmonary opacity. No acute bony abnormalities identified.  IMPRESSION: New tiny bilateral pleural effusions without other significant change.   Original Report Authenticated By: Harmon Pier, M.D.      1. Weakness   2. EKG abnormality   3. UTI (urinary tract infection)       MDM  Progressive generalized weakness for the past several weeks with increased sleeping. No nausea, vomiting, fever, cough or chills. No chest pain or shortness of breath. No focal neuro deficits. Repeat EKG shows less prominent ST depressions inferolaterally. Mild Cr elevation, hemoglobin stable.  Ambulatory with walker and at baseline per granddaughter, tolerating PO. Treat UTI. Follow up with PCP regarding EKG abnormality. No CP or SOB. Troponin negative/    Date: 08/22/2012  Rate: 95  Rhythm: normal sinus rhythm  QRS Axis: normal  Intervals: normal  ST/T Wave abnormalities: ST  depressions inferiorly and ST depressions laterally  Conduction Disutrbances:none  Narrative Interpretation: A new ST depression inferior laterally  Old EKG Reviewed: changes noted     Date: 08/22/2012  Rate: 87  Rhythm: normal sinus rhythm  QRS Axis: normal  Intervals: normal  ST/T Wave abnormalities: ST depressions inferiorly and ST depressions laterally  Conduction Disutrbances:none  Narrative Interpretation: less prominent than previous  Old EKG Reviewed: changes noted    Glynn Octave, MD 08/22/12 1814

## 2012-08-23 LAB — URINE CULTURE

## 2012-08-24 ENCOUNTER — Telehealth (HOSPITAL_COMMUNITY): Payer: Self-pay | Admitting: Emergency Medicine

## 2012-08-24 NOTE — ED Notes (Signed)
Results received from [facility]. (+) URNC -> >/= 100,000 colonies, Viridans Strep.  Rx given in ED for Sulfa -Trimeth -> no sens. Provided  Chart to MD office for review.

## 2012-08-26 ENCOUNTER — Ambulatory Visit (INDEPENDENT_AMBULATORY_CARE_PROVIDER_SITE_OTHER): Payer: Medicare Other | Admitting: Family Medicine

## 2012-08-26 ENCOUNTER — Encounter: Payer: Self-pay | Admitting: Family Medicine

## 2012-08-26 DIAGNOSIS — R9431 Abnormal electrocardiogram [ECG] [EKG]: Secondary | ICD-10-CM

## 2012-08-26 DIAGNOSIS — N390 Urinary tract infection, site not specified: Secondary | ICD-10-CM

## 2012-08-26 NOTE — Progress Notes (Signed)
  Subjective:    Patient ID: Ashley Fisher, female    DOB: 02-26-1916, 79 y.o.   MRN: [id number]  HPI She was recently seen in the emergency room and evaluated for a two-week history of decreased physical functioning, weakness and fatigue. The emergency room record was reviewed and did show evidence of UTI as well as an abnormal EKG. She has had no chest pain, shortness of breath, DOE.   Review of Systems     Objective:   Physical Exam Alert and in no distress. Cardiac exam shows regular rhythm without murmurs or gallops. Lungs are clear to auscultation. Review of EKG from previous EKG of 2012 does show some potential ischemic changes.       Assessment & Plan:  UTI (lower urinary tract infection)  Abnormal EKG - Plan: Ambulatory referral to Cardiology recommend he return here in 10 days to 2 weeks to reevaluate her urine. Since she does have EKG changes, cardiology referral for possible intervention is appropriate. Discussed hospital catheterization and stenting with the family. She is certain not a candidate for surgical intervention.

## 2012-08-30 ENCOUNTER — Encounter: Payer: Self-pay | Admitting: Cardiology

## 2012-08-30 DIAGNOSIS — Z8611 Personal history of tuberculosis: Secondary | ICD-10-CM | POA: Insufficient documentation

## 2012-08-30 NOTE — Progress Notes (Unsigned)
Patient ID: YAJAIRA DOFFING, female   DOB: June 16, 1915, 79 y.o.   MRN: [id number]  [place], [place]    Date of visit:  08/30/2012 DOB:  [birth year]    Age:  79 yrs. Medical record number:  [id number]     Account number:  [id number] Primary Care Provider: Sharlot Gowda C ____________________________ CURRENT DIAGNOSES  1. Malaise and fatigue  2. Abnormal EKG  3. Hypertension,Essential (Benign)  4. Pernicious Anemia ____________________________ ALLERGIES  NKDA ____________________________ MEDICATIONS  1. nifedipine 30 mg tablet extended release 24hr, 1 p.o. daily ____________________________ CHIEF COMPLAINTS  Abd ekg ____________________________ HISTORY OF PRESENT ILLNESS  This very nice 79 year old black female is seen at the request of Dr. Susann Givens for evaluation of an abnormal EKG. The patient currently lives with a granddaughter and moved up here several years ago from [place],  [place]. She has never had a prior cardiac history. She has largely been healthy except for mild hypertension. She was admitted to the hospital with weakness and found to be pancytopenic and was eventually diagnosed with pernicious anemia. This evidently responded well to B12 injections. She will be 79 on her next birthday and has become progressively more feeble but is still quite active. She has been able to travel to the beach and has to use a walker to walk and sometimes uses a wheelchair. She denies any angina or shortness of breath. She does not have edema and states that she can do her normal activities around the house without limitation. She does not have PND, orthopnea or claudication.  She had a recent emergency room visit was found to have some ST depression laterally and also was found to have a urinary tract infection. I was asked to assess her because of the abnormal EKG.____________________________ PAST HISTORY  Past Medical Illnesses:  history of pernicious anemia, history of tuberculosis 1995, hypertension;   Cardiovascular Illnesses:  no previous history of cardiac disease.;  Surgical Procedures:  no prior surgical procedures;  Cardiology Procedures-Invasive:  no history of prior cardiac procedures;  Cardiology Procedures-Noninvasive:  no previous non-invasive procedures;  LVEF not documented ,  ____________________________ CARDIO-PULMONARY TEST DATES EKG Date:  08/30/2012;   ____________________________ FAMILY HISTORY Father -  deceased; Mother -  deceased; Brother 1 -  deceased; Brother 2 -  deceased; Brother 3 -  deceased; Sister 1 -  deceased;  ____________________________ SOCIAL HISTORY Alcohol Use:  no alcohol use;  Smoking:  never smoked;  Diet:  regular diet;  Lifestyle:  widowed;  Exercise:  no regular exercise;  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 Eyes: wears eye glasses/contact lenses, cataract extraction bilaterally Ears, Nose, Throat, Mouth:  denies any hearing loss, epistaxis, hoarseness or difficulty speaking. Respiratory: denies dyspnea, cough, wheezing or hemoptysis. Cardiovascular:  please review HPI Abdominal: denies dyspepsia, GI bleeding, constipation, or diarrheaGenitourinary-Female: recent urinary tract infection Musculoskeletal:  denies arthritis Neurological:  denies headaches, stroke, or TIA Psychiatric:  denies depession or anxiety.  ____________________________ PHYSICAL EXAMINATION VITAL SIGNS  Blood Pressure:  120/60 Sitting, Left arm, regular cuff  , 104/56 Standing, Left arm and regular cuff   Pulse:  88/min. Weight:  120.00 lbs. Height:  71"BMI: 17  Constitutional:  pleasant African American female in no acute distress, thin Skin:  warm and dry to touch, no apparent skin lesions or masses noted, some Head:  normocephalic, normal hair pattern, no masses or tenderness Eyes:  EOMS Intact, PERRLA, C and S  clear, Funduscopic exam not done. ENT:  ears, nose and  throat reveal no gross abnormalities.  Dentition good. Neck:  supple, without massess. No JVD, thyromegaly or carotid bruits. Carotid upstroke normal. Chest:  normal symmetry, clear to auscultation and percussion. Cardiac:  regular rhythm, normal S1 and S2, No S3 or S4, no murmurs, gallops or rubs detected. Abdomen:  abdomen soft,non-tender, no masses, no hepatospenomegaly, or aneurysm noted Peripheral Pulses:  the femoral,dorsalis pedis, and posterior tibial pulses are full and equal bilaterally with no bruits auscultated. Extremities & Back:  no deformities, clubbing, cyanosis, erythema or edema observed. Normal muscle strength and tone. Neurological:  not oriented to date, walks slowly with assistance ____________________________ IMPRESSIONS/PLAN  1. Abnormal EKG in a patient without evidence of clinical angina and who does not appear to be limited by dyspnea or other cardiac issues. 2. History of pulmonary tuberculosis treated in 1995 3. Hypertension 4. History of pernicious anemia 5. Significantly underweight  Recommendations:  I reviewed the EKG and she could possibly have an old anteroseptal infarction but does not have significant ST depression. It is also possible that she just has poor R-wave progression. At her age of almost 79, I would not suggest any further cardiac testing or workup at this time. By her history she did not appear to be limited and his not have symptomatology the think that we could address. I would be concerned about side effects of medication or potential complications of diagnostic testing in her. She appears to be remarkably well preserved although she could not tell me the date today. She is somewhat functionally limited at home. I would be happy to see her again should the need arise. ____________________________ TODAYS ORDERS  1. Return: prn  2. 12 Lead EKG: Today                       ____________________________ Cardiology Physician:  Darden Palmer MD [facility]

## 2012-10-20 ENCOUNTER — Emergency Department (HOSPITAL_COMMUNITY): Payer: Medicare Other

## 2012-10-20 ENCOUNTER — Encounter (HOSPITAL_COMMUNITY): Payer: Self-pay | Admitting: Radiology

## 2012-10-20 ENCOUNTER — Emergency Department (HOSPITAL_COMMUNITY)
Admission: EM | Admit: 2012-10-20 | Discharge: 2012-10-20 | Disposition: A | Discharge status: Home / Self Care | Payer: Medicare Other | Attending: Emergency Medicine | Admitting: Emergency Medicine

## 2012-10-20 DIAGNOSIS — Y939 Activity, unspecified: Secondary | ICD-10-CM | POA: Insufficient documentation

## 2012-10-20 DIAGNOSIS — M4120 Other idiopathic scoliosis, site unspecified: Secondary | ICD-10-CM | POA: Insufficient documentation

## 2012-10-20 DIAGNOSIS — E785 Hyperlipidemia, unspecified: Secondary | ICD-10-CM | POA: Insufficient documentation

## 2012-10-20 DIAGNOSIS — Z862 Personal history of diseases of the blood and blood-forming organs and certain disorders involving the immune mechanism: Secondary | ICD-10-CM | POA: Insufficient documentation

## 2012-10-20 DIAGNOSIS — I10 Essential (primary) hypertension: Secondary | ICD-10-CM | POA: Insufficient documentation

## 2012-10-20 DIAGNOSIS — W19XXXA Unspecified fall, initial encounter: Secondary | ICD-10-CM | POA: Insufficient documentation

## 2012-10-20 DIAGNOSIS — S0990XA Unspecified injury of head, initial encounter: Secondary | ICD-10-CM | POA: Insufficient documentation

## 2012-10-20 DIAGNOSIS — Y929 Unspecified place or not applicable: Secondary | ICD-10-CM | POA: Insufficient documentation

## 2012-10-20 DIAGNOSIS — N390 Urinary tract infection, site not specified: Secondary | ICD-10-CM

## 2012-10-20 DIAGNOSIS — M810 Age-related osteoporosis without current pathological fracture: Secondary | ICD-10-CM | POA: Insufficient documentation

## 2012-10-20 DIAGNOSIS — Z79899 Other long term (current) drug therapy: Secondary | ICD-10-CM | POA: Insufficient documentation

## 2012-10-20 LAB — URINALYSIS, ROUTINE W REFLEX MICROSCOPIC
Bilirubin Urine: NEGATIVE
Glucose, UA: NEGATIVE mg/dL
Ketones, ur: NEGATIVE mg/dL
Nitrite: POSITIVE — AB
Specific Gravity, Urine: 1.02 (ref 1.005–1.030)
pH: 5.5 (ref 5.0–8.0)

## 2012-10-20 LAB — URINE MICROSCOPIC-ADD ON

## 2012-10-20 MED ORDER — CEPHALEXIN 500 MG PO CAPS: 500.0000 mg | ORAL_CAPSULE | Freq: Four times a day (QID) | ORAL | Status: DC

## 2012-10-20 NOTE — ED Provider Notes (Signed)
History     CSN: [id number]  Arrival date & time 10/20/12  1240   None     Chief Complaint  Patient presents with  . Fall  . Head Laceration    (Consider location/radiation/quality/duration/timing/severity/associated sxs/prior treatment) HPI Comments: Patient had a mechanical fall today and struck her right posterior scalp causing a laceration. Patient is on no blood thinning medications. She currently denies pain, including in her neck and head. She was able to walk down a flight of stairs after the fall with assistance. Laceration initially bled however is now hemostatic. No chest pain or shortness of breath. Patient acting at her baseline per family member who is present. No blurry vision, vomiting, weakness or numbness in extremities. Onset acute. Course constant. Nothing makes symptoms better or worse.  The history is provided by the patient and a relative.    Past Medical History  Diagnosis Date  . Hypertension   . Osteoporosis   . Scoliosis   . Dyslipidemia   . Vitamin D deficiency     No past surgical history on file.  Family History  Problem Relation Age of Onset  . Stroke Mother   . Cancer Sister     History  Substance Use Topics  . Smoking status: Never Smoker   . Smokeless tobacco: Never Used  . Alcohol Use: No    OB History   Grav Para Term Preterm Abortions TAB SAB Ect Mult Living                  Review of Systems  Constitutional: Negative for fatigue.  HENT: Negative for neck pain and tinnitus.   Eyes: Negative for photophobia, pain and visual disturbance.  Respiratory: Negative for shortness of breath.   Cardiovascular: Negative for chest pain.  Gastrointestinal: Negative for nausea and vomiting.  Musculoskeletal: Negative for back pain and gait problem.  Skin: Positive for wound.  Neurological: Negative for dizziness, weakness, light-headedness, numbness and headaches.  Psychiatric/Behavioral: Negative for confusion and decreased  concentration.    Allergies  Review of patient's allergies indicates no known allergies.  Home Medications   Current Outpatient Rx  Name  Route  Sig  Dispense  Refill  . NIFEdipine (NIFEDICAL XL) 30 MG 24 hr tablet   Oral   Take 1 tablet (30 mg total) by mouth daily.   90 tablet   3     BP 157/79  Pulse 73  Temp(Src) 98.3 F (36.8 C) (Oral)  Resp 16  SpO2 100%  Physical Exam  Nursing note and vitals reviewed. Constitutional: She is oriented to person, place, and time. She appears well-developed and well-nourished.  HENT:  Head: Normocephalic and atraumatic. Head is without raccoon's eyes and without Battle's sign.  Right Ear: Tympanic membrane, external ear and ear canal normal. No hemotympanum.  Left Ear: Tympanic membrane, external ear and ear canal normal. No hemotympanum.  Nose: Nose normal. No nasal septal hematoma.  Mouth/Throat: Uvula is midline, oropharynx is clear and moist and mucous membranes are normal.  Eyes: Conjunctivae, EOM and lids are normal. Pupils are equal, round, and reactive to light. Right eye exhibits no nystagmus. Left eye exhibits no nystagmus.  No visible hyphema noted  Neck: Normal range of motion. Neck supple.  Cardiovascular: Normal rate and regular rhythm.   Pulmonary/Chest: Effort normal and breath sounds normal.  Abdominal: Soft. There is no tenderness.  Musculoskeletal:       Cervical back: She exhibits normal range of motion, no tenderness and no bony  tenderness.       Thoracic back: She exhibits no tenderness and no bony tenderness.       Lumbar back: She exhibits no tenderness and no bony tenderness.  Neurological: She is alert and oriented to person, place, and time. She has normal strength and normal reflexes. No cranial nerve deficit or sensory deficit. Coordination normal. GCS eye subscore is 4. GCS verbal subscore is 5. GCS motor subscore is 6.  Skin: Skin is warm and dry.  Small 4mm laceration R posterior scalp, linear,  hemostatic.  Psychiatric: She has a normal mood and affect.    ED Course  Procedures (including critical care time)  Labs Reviewed - No data to display Ct Head Wo Contrast  10/20/2012   *RADIOLOGY REPORT*  Clinical Data:  Fall.  Trauma to forehead.  CT HEAD WITHOUT CONTRAST CT CERVICAL SPINE WITHOUT CONTRAST  Technique:  Multidetector CT imaging of the head and cervical spine was performed following the standard protocol without intravenous contrast.  Multiplanar CT image reconstructions of the cervical spine were also generated.  Comparison:  CT head without contrast 11/04/2009.  CT HEAD  Findings: Atrophy and periventricular white matter disease has progressed.  The no acute cortical infarct, hemorrhage, or mass lesion is present.  The ventricles are proportionate to the degree of atrophy.  The paranasal sinuses and mastoid air cells are clear. Atherosclerotic calcifications are present in the cavernous carotid arteries.  No significant extracranial soft tissue injury is evident.  The osseous skull is intact.  IMPRESSION:  1.  Progressive atrophy and diffuse white matter disease. 2.  No acute abnormality.  CT CERVICAL SPINE  Findings: The cervical spine is imaged from skull base through T4. Exaggerated upper thoracic kyphosis and creates a very flat spine angle.  Moderate facet degenerative changes are present bilaterally.  Uncovertebral disease is greatest at C5-6.  The central canal is widely patent.  There is mild rightward curvature of the lower cervical spine.  Nodular peripheral airspace disease is present with some calcifications.  IMPRESSION:  1.  Moderate spondylosis of the cervical spine is most pronounced at C6-7. 2.  No acute fractures. 3.  Nodular disease at the apical pleural bilaterally.  While this may represent atelectasis, neoplasm cannot be excluded.  Recommend follow-up CT of the chest with contrast in 3 months.   Original Report Authenticated By: Marin Roberts, M.D.   Ct  Cervical Spine Wo Contrast  10/20/2012   *RADIOLOGY REPORT*  Clinical Data:  Fall.  Trauma to forehead.  CT HEAD WITHOUT CONTRAST CT CERVICAL SPINE WITHOUT CONTRAST  Technique:  Multidetector CT imaging of the head and cervical spine was performed following the standard protocol without intravenous contrast.  Multiplanar CT image reconstructions of the cervical spine were also generated.  Comparison:  CT head without contrast 11/04/2009.  CT HEAD  Findings: Atrophy and periventricular white matter disease has progressed.  The no acute cortical infarct, hemorrhage, or mass lesion is present.  The ventricles are proportionate to the degree of atrophy.  The paranasal sinuses and mastoid air cells are clear. Atherosclerotic calcifications are present in the cavernous carotid arteries.  No significant extracranial soft tissue injury is evident.  The osseous skull is intact.  IMPRESSION:  1.  Progressive atrophy and diffuse white matter disease. 2.  No acute abnormality.  CT CERVICAL SPINE  Findings: The cervical spine is imaged from skull base through T4. Exaggerated upper thoracic kyphosis and creates a very flat spine angle.  Moderate facet degenerative changes  are present bilaterally.  Uncovertebral disease is greatest at C5-6.  The central canal is widely patent.  There is mild rightward curvature of the lower cervical spine.  Nodular peripheral airspace disease is present with some calcifications.  IMPRESSION:  1.  Moderate spondylosis of the cervical spine is most pronounced at C6-7. 2.  No acute fractures. 3.  Nodular disease at the apical pleural bilaterally.  While this may represent atelectasis, neoplasm cannot be excluded.  Recommend follow-up CT of the chest with contrast in 3 months.   Original Report Authenticated By: Marin Roberts, M.D.     1. Head injury, initial encounter   2. Urinary tract infection     1:53 PM Patient seen and examined. Work-up initiated.   Vital signs reviewed and are  as follows: Filed Vitals:   10/20/12 1257  BP: 157/79  Pulse: 73  Temp: 98.3 F (36.8 C)  Resp: 16   CT head/neck findings reviewed. Patient discussed with and seen by Dr. Effie Shy. Will check UA prior to discharge.   UA showed UTI. Previous cx + viridans strep. Tx with keflex.   Patient and granddaughter informed. Urged PCP f/u in next week for recheck of urine.   Patient was counseled on head injury precautions and symptoms that should indicate their return to the ED.  These include severe worsening headache, vision changes, confusion, loss of consciousness, trouble walking, nausea & vomiting, or weakness/tingling in extremities.       MDM  Minor scalp lac not requiring repair.   Head/neck CT neg for acute findings.   UA shows UTI -- antibiotics given. No concern for sepsis. Pt appears well.   No CP, lightheadedness, palps prior to fall and patient did not lose consciousness. Suspect mechanical in nature. Patient/family reliable historians.         Renne Crigler, PA-C 10/20/12 [phone]

## 2012-10-20 NOTE — ED Provider Notes (Signed)
Ashley Fisher is a 79 y.o. female, who was at home when she fell. She was alone when she fell, but her daughter heard her fall. She got up with assistance after the fall. There are no reported injuries  Exam alert, elderly, calm. Head no pulp will or visible contusion. Neck appears supple. Extremities normal range of motion. No deformity.  Neurologic grossly nonfocal. She is responsive, but not communicative and.  Assessment fall without clear cause. No significant injury appreciated.   Medical screening examination/treatment/procedure(s) were conducted as a shared visit with non-physician practitioner(s) and myself.  I personally evaluated the patient during the encounter  Flint Melter, MD 10/20/12 Ernestina [place]

## 2012-10-20 NOTE — ED Notes (Signed)
Pt came in w/ granddaughter. Granddaughter states pt was walking to quickly and fell. Pt has no co pain. Small gouge noted on top rt scalp.

## 2012-10-22 LAB — URINE CULTURE: Colony Count: >=100000

## 2012-10-23 ENCOUNTER — Telehealth (HOSPITAL_COMMUNITY): Payer: Self-pay | Admitting: Emergency Medicine

## 2012-10-23 NOTE — ED Notes (Signed)
Post ED Visit - Positive Culture Follow-up  Culture report reviewed by antimicrobial stewardship pharmacist: []  Wes Dulaney, Pharm.D., BCPS []  Celedonio Miyamoto, [address].D., BCPS []  Georgina Pillion, [address].D., BCPS []  Schubert, [address].D., BCPS, AAHIVP []  Estella Husk, Pharm.D., BCPS, AAHIV [x]  Laurence Slate, [address].D.  Positive urine culture Treated with Keflex, organism sensitive to the same and no further patient follow-up is required at this time.  Kylie A Holland 10/23/2012, 6:19 PM

## 2012-11-10 ENCOUNTER — Ambulatory Visit (INDEPENDENT_AMBULATORY_CARE_PROVIDER_SITE_OTHER): Payer: Medicare Other | Admitting: Family Medicine

## 2012-11-10 DIAGNOSIS — Z87440 Personal history of urinary (tract) infections: Secondary | ICD-10-CM

## 2012-11-10 NOTE — Progress Notes (Signed)
  Subjective:    Patient ID: Ashley Fisher, female    DOB: May 03, 1916, 79 y.o.   MRN: [id number]  HPI She is here for recheck on her urine. She was unable to give [place] a specimen.   Review of Systems     Objective:   Physical Exam Alert and in no distress otherwise not examined       Assessment & Plan:  History of UTI I will have her caregiver bring a specimen by in the morning.

## 2012-11-11 ENCOUNTER — Other Ambulatory Visit (INDEPENDENT_AMBULATORY_CARE_PROVIDER_SITE_OTHER): Payer: Medicare Other

## 2012-11-11 DIAGNOSIS — R8290 Unspecified abnormal findings in urine: Secondary | ICD-10-CM

## 2012-11-11 DIAGNOSIS — R82998 Other abnormal findings in urine: Secondary | ICD-10-CM

## 2012-11-11 LAB — POCT URINALYSIS DIPSTICK
Bilirubin, UA: NEGATIVE
Blood, UA: 50
Ketones, UA: NEGATIVE
Spec Grav, UA: 1.015
pH, UA: 7

## 2012-11-11 NOTE — Progress Notes (Signed)
Quick Note:  Granddaughter said Ashley Fisher is not having any symptoms informed her sending off for culture and will let her know something as soon as we get results ______

## 2012-11-14 LAB — URINE CULTURE

## 2012-11-15 MED ORDER — AMOXICILLIN 875 MG PO TABS: 875.0000 mg | ORAL_TABLET | Freq: Two times a day (BID) | ORAL | Status: DC

## 2012-11-29 ENCOUNTER — Ambulatory Visit (INDEPENDENT_AMBULATORY_CARE_PROVIDER_SITE_OTHER): Payer: Medicare Other | Admitting: Family Medicine

## 2012-11-29 DIAGNOSIS — N390 Urinary tract infection, site not specified: Secondary | ICD-10-CM | POA: Diagnosis not present

## 2012-11-29 LAB — POCT URINALYSIS DIPSTICK
Bilirubin, UA: NEGATIVE
Leukocytes, UA: NEGATIVE
Nitrite, UA: NEGATIVE
Protein, UA: NEGATIVE
pH, UA: 6

## 2013-01-12 ENCOUNTER — Other Ambulatory Visit: Payer: Self-pay

## 2013-03-28 ENCOUNTER — Ambulatory Visit (INDEPENDENT_AMBULATORY_CARE_PROVIDER_SITE_OTHER): Payer: Medicare Other | Admitting: Family Medicine

## 2013-03-28 ENCOUNTER — Encounter: Payer: Self-pay | Admitting: Family Medicine

## 2013-03-28 VITALS — BP 110/70 | HR 68

## 2013-03-28 DIAGNOSIS — Z79899 Other long term (current) drug therapy: Secondary | ICD-10-CM

## 2013-03-28 DIAGNOSIS — E785 Hyperlipidemia, unspecified: Secondary | ICD-10-CM

## 2013-03-28 DIAGNOSIS — E559 Vitamin D deficiency, unspecified: Secondary | ICD-10-CM

## 2013-03-28 DIAGNOSIS — I10 Essential (primary) hypertension: Secondary | ICD-10-CM

## 2013-03-28 DIAGNOSIS — M810 Age-related osteoporosis without current pathological fracture: Secondary | ICD-10-CM

## 2013-03-28 DIAGNOSIS — Z23 Encounter for immunization: Secondary | ICD-10-CM

## 2013-03-28 DIAGNOSIS — N390 Urinary tract infection, site not specified: Secondary | ICD-10-CM

## 2013-03-28 LAB — COMPREHENSIVE METABOLIC PANEL
ALT: 12 U/L (ref 0–35)
AST: 20 U/L (ref 0–37)
Albumin: 4 g/dL (ref 3.5–5.2)
Alkaline Phosphatase: 66 U/L (ref 39–117)
Potassium: 3.9 mEq/L (ref 3.5–5.3)
Sodium: 139 mEq/L (ref 135–145)
Total Protein: 7 g/dL (ref 6.0–8.3)

## 2013-03-28 LAB — CBC WITH DIFFERENTIAL/PLATELET
Basophils Absolute: 0 10*3/uL (ref 0.0–0.1)
Basophils Relative: 1 % (ref 0–1)
Hemoglobin: 12.2 g/dL (ref 12.0–15.0)
Lymphocytes Relative: 33 % (ref 12–46)
MCHC: 33.6 g/dL (ref 30.0–36.0)
Neutro Abs: 2.3 10*3/uL (ref 1.7–7.7)
Neutrophils Relative %: 55 % (ref 43–77)
RDW: 15.4 % (ref 11.5–15.5)
WBC: 4.2 10*3/uL (ref 4.0–10.5)

## 2013-03-28 LAB — LIPID PANEL: LDL Cholesterol: 134 mg/dL — ABNORMAL HIGH (ref 0–99)

## 2013-03-28 NOTE — Progress Notes (Signed)
  Subjective:    Patient ID: Ashley Fisher, female    DOB: Aug 06, 1915, 79 y.o.   MRN: [id number]  HPI She is here for medication check. She is now 79 years old. She has no particular concerns or complaints. She is very astute and quick with the response. She still has a very good sense of humor. She continues on medications listed in the chart. She is here with her relatives. She does have a previous history of elevated lipids as well as osteoporosis. She does stay fairly active. She has a previous history of vitamin D deficiency. There is a question as to possible UTI due to abnormal urine smell. Review of Systems Negative except as above    Objective:   Physical Exam alert and in no distress. Tympanic membranes and canals are normal. Throat is clear. Tonsils are normal. Neck is supple without adenopathy or thyromegaly. Cardiac exam shows a regular sinus rhythm without murmurs or gallops. Lungs are clear to auscultation. Abdominal exam shows no masses or tenderness        Assessment & Plan:  HTN (hypertension) - Plan: CBC with Differential, Comprehensive metabolic panel  Dyslipidemia - Plan: Lipid panel  Unspecified vitamin D deficiency  Need for prophylactic vaccination and inoculation against influenza - Plan: Flu vaccine HIGH DOSE PF (Fluzone Tri High dose)  Encounter for long-term (current) use of other medications  Osteoporosis  I attempted to get a vitamin D level however I could not get it through the system to be paid for by Medicare. They will bring a urine specimen. Flu shot given with risks and benefits discussed

## 2013-03-29 ENCOUNTER — Other Ambulatory Visit (INDEPENDENT_AMBULATORY_CARE_PROVIDER_SITE_OTHER): Payer: Medicare Other

## 2013-03-29 DIAGNOSIS — R319 Hematuria, unspecified: Secondary | ICD-10-CM

## 2013-03-29 LAB — POCT URINALYSIS DIPSTICK
Bilirubin, UA: NEGATIVE
Glucose, UA: NEGATIVE
Nitrite, UA: POSITIVE
pH, UA: 5

## 2013-03-29 MED ORDER — SULFAMETHOXAZOLE-TMP DS 800-160 MG PO TABS: 1.0000 | ORAL_TABLET | Freq: Two times a day (BID) | ORAL | Status: DC

## 2013-03-29 NOTE — Addendum Note (Signed)
Addended by: Ronnald Nian on: 03/29/2013 12:59 PM   Modules accepted: Orders

## 2013-03-29 NOTE — Progress Notes (Signed)
  Subjective:    Patient ID: Ashley Fisher, female    DOB: 19-Jun-1915, 79 y.o.   MRN: [id number]  HPI    Review of Systems     Objective:   Physical Exam        Assessment & Plan:  The urinalysis dipstick and microscopic was positive. She has a previous history of UTI with Escherichia coli. I will treat her with Septra and have her return here in 2 weeks for recheck urinalysis.

## 2013-04-07 ENCOUNTER — Other Ambulatory Visit: Payer: Self-pay | Admitting: Family Medicine

## 2013-05-09 ENCOUNTER — Telehealth: Payer: Self-pay | Admitting: Family Medicine

## 2013-05-09 NOTE — Telephone Encounter (Signed)
A rrange to have home health to make a visit to assess her needs

## 2013-05-09 NOTE — Telephone Encounter (Signed)
Please call Ashley Fisher is having a lot of trouble getting up and down and she is wanting to check on getting a lift chair for her  But wanted to see if her insurances would help that

## 2013-05-09 NOTE — Telephone Encounter (Signed)
REFERRED TO Frances Furbish

## 2013-05-30 ENCOUNTER — Encounter: Payer: Self-pay | Admitting: Family Medicine

## 2013-05-30 ENCOUNTER — Ambulatory Visit (INDEPENDENT_AMBULATORY_CARE_PROVIDER_SITE_OTHER): Payer: Medicare Other | Admitting: Family Medicine

## 2013-05-30 VITALS — BP 150/80 | HR 80 | Temp 97.6°F | Ht 69.0 in | Wt 115.0 lb

## 2013-05-30 DIAGNOSIS — J069 Acute upper respiratory infection, unspecified: Secondary | ICD-10-CM

## 2013-05-30 NOTE — Patient Instructions (Signed)
Drink plenty of fluids. Use tylenol as needed for fever or pain. You can use Coricidin HBP or claritin or zyrtec as needed for nasal congestion You can use Mucinex or Robitussin (plain or DM) as needed for cough.  Return for re-evaluation if fever recurs, discolored mucus, headaches, dizziness, if any shortness of breath or worsening cough. Return if any confusion, mental status changes (seems lethargic, sleepy, difficult to arouse), dehydration or any other concerns.

## 2013-05-30 NOTE — Progress Notes (Signed)
Chief Complaint  Patient presents with  . Cough    and nasal congestion that is clear x 4 days. Always has a cough it has just worsened over the last 4 days. Granddaughter just wantd to make sure she doesn't have bronchitis or pneumonia because of her age. Did have a temp of 100.3 this past Friday, but no other days.    Started about 5 days ago with nasal congestion, hoarseness.  She denies sore throat, ear pain.  Nasal mucus is clear, and she has had a cough moreso than usual.  She has a chronic cough, at night, but is now coughing during the day.  Cough is nonproductive, only occasional.  She had a fever 3 nights ago, 100.3.  She hasn't had any fevers since.   She has taken a dose of children's benadryl, and one dose of a cold and sinus daytime medication (children's dose). Not sure if it helped at all.  She has been using Vick's vapo-rub, which may have helped some.  Past Medical History  Diagnosis Date  . Hypertension   . Osteoporosis   . Scoliosis   . Dyslipidemia   . Vitamin D deficiency    History reviewed. No pertinent past surgical history.  History   Social History  . Marital Status: Widowed    Spouse Name: N/A    Number of Children: N/A  . Years of Education: N/A   Occupational History  . Not on file.   Social History Main Topics  . Smoking status: Never Smoker   . Smokeless tobacco: Never Used  . Alcohol Use: No  . Drug Use: No  . Sexual Activity: Not on file   Other Topics Concern  . Not on file   Social History Narrative   Lives with her granddaughter, (and her husband), and 2 great-grandchildren. Grandson-in-law smokes in the garage.  No other tobacco exposure   Current outpatient prescriptions:NIFEDICAL XL 30 MG 24 hr tablet, TAKE 1 TABLET BY MOUTH DAILY, Disp: 90 tablet, Rfl: 3 Didn't take dose of BP med yet today (forgot, they were out of town).  No Known Allergies  ROS:  Denies headaches, dizziness, chest pain, shortness of breath, rashes, bleeding,  bruising, nausea, vomiting, diarrhea, myalgias or other complaints.  See HPI  PHYSICAL EXAM: BP 150/80  Pulse 80  Temp(Src) 97.6 F (36.4 C) (Oral)  Ht 5\' 9"  (1.753 m)  Wt 115 lb (52.164 kg)  BMI 16.97 kg/m2  Very pleasant, kyphotic, elderly female in a wheelchair.  Sitting comfortably, not coughing or sniffling.  Granddaughter is helping provide most of the history, but patient is confirming. HEENT:  PERRL, EOMI, conjunctiva clear.  TM's and EAC's normal.  OP clear, moist mucus membranes. Nasal mucosa is not erythematous, small amount of clear mucus is present in both nares.  Sinuses are nontender Neck:no lymphadenopathy Heart: regular rate and rhythm Lungs: clear with good air movement throughout.  No wheezes, rales, ronchi Skin: no rashes Psych: normal mood, affect, hygiene and grooming  ASSESSMENT/PLAN:  Acute upper respiratory infections of unspecified site  Drink plenty of fluids. Use tylenol as needed for fever or pain. You can use Coricidin HBP or claritin or zyrtec as needed for nasal congestion You can use Mucinex or Robitussin (plain or DM) as needed for cough.  Return for re-evaluation if fever recurs, discolored mucus, headaches, dizziness, if any shortness of breath or worsening cough. Return if any confusion, mental status changes (seems lethargic, sleepy, difficult to arouse), dehydration or any other  concerns.

## 2013-10-14 ENCOUNTER — Ambulatory Visit
Admission: RE | Admit: 2013-10-14 | Discharge: 2013-10-14 | Disposition: A | Discharge status: Home / Self Care | Payer: Medicare Other | Source: Ambulatory Visit | Attending: Family Medicine | Admitting: Family Medicine

## 2013-10-14 ENCOUNTER — Ambulatory Visit (INDEPENDENT_AMBULATORY_CARE_PROVIDER_SITE_OTHER): Payer: Medicare Other | Admitting: Family Medicine

## 2013-10-14 ENCOUNTER — Encounter: Payer: Self-pay | Admitting: Family Medicine

## 2013-10-14 VITALS — BP 116/72 | HR 80

## 2013-10-14 DIAGNOSIS — M79642 Pain in left hand: Secondary | ICD-10-CM

## 2013-10-14 DIAGNOSIS — M79609 Pain in unspecified limb: Secondary | ICD-10-CM

## 2013-10-14 NOTE — Progress Notes (Signed)
   Subjective:    Patient ID: Ashley Fisher, female    DOB: 04-08-1916, 80 y.o.   MRN: [id number]  HPI She complains of left fifth finger pain at the MCP joint. No history of recent injury. This is been going on for several weeks. She cannot fully extend her hand.  Review of Systems     Objective:   Physical Exam Minimal tenderness to palpation at the MCP joint however with attempts at extension, she complains of pain. Full motion of the other fingers without pain on motion or palpation.       Assessment & Plan:  Left hand pain - Plan: DG Hand 2 View Left

## 2013-11-22 ENCOUNTER — Telehealth: Payer: Self-pay

## 2013-11-22 NOTE — Telephone Encounter (Signed)
[name] (pt grandaughter) called stating that pt has had x-ray of hand and the pp that did the x ray told her that pt could go to a hand specialist if she wanted to. Were you aware of this and if so what needs to be done and where does she need to go next?

## 2013-11-22 NOTE — Telephone Encounter (Signed)
The x-ray did not show anything broken. If she is still having difficulty with the hand been seeing a specialist is okay

## 2013-11-23 NOTE — Telephone Encounter (Signed)
Information faxed over to [facility] and they will contact pt

## 2014-04-06 ENCOUNTER — Other Ambulatory Visit: Payer: Self-pay | Admitting: Family Medicine

## 2014-04-06 ENCOUNTER — Encounter: Payer: Self-pay | Admitting: Family Medicine

## 2014-04-06 ENCOUNTER — Ambulatory Visit (INDEPENDENT_AMBULATORY_CARE_PROVIDER_SITE_OTHER): Payer: Medicare Other | Admitting: Family Medicine

## 2014-04-06 VITALS — BP 110/70 | HR 66

## 2014-04-06 DIAGNOSIS — M810 Age-related osteoporosis without current pathological fracture: Secondary | ICD-10-CM

## 2014-04-06 DIAGNOSIS — E538 Deficiency of other specified B group vitamins: Secondary | ICD-10-CM

## 2014-04-06 DIAGNOSIS — Z23 Encounter for immunization: Secondary | ICD-10-CM

## 2014-04-06 DIAGNOSIS — I10 Essential (primary) hypertension: Secondary | ICD-10-CM

## 2014-04-06 MED ORDER — NIFEDIPINE ER OSMOTIC RELEASE 30 MG PO TB24: ORAL_TABLET | ORAL | Status: DC

## 2014-04-06 NOTE — Progress Notes (Signed)
   Subjective:    Patient ID: Ashley Fisher, female    DOB: [birth year], 80 y.o.   MRN: [id number]  HPI She is here for an interval evaluation. She continues to enjoy good health for 80 year old woman. She is only on her blood pressure medication. She does not take a multivitamin although she does have a previous history of vitamin B12 deficiency. She also has a history of osteoporosis. She has no particular concerns or complaints.   Review of Systems     Objective:   Physical Exam alert and in no distress. Tympanic membranes and canals are normal. Throat is clear. Tonsils are normal. Neck is supple without adenopathy or thyromegaly. Cardiac exam shows a regular sinus rhythm without murmurs or gallops. Lungs are clear to auscultation.        Assessment & Plan:  Essential hypertension - Plan: CBC with Differential, Comprehensive metabolic panel, NIFEdipine (NIFEDICAL XL) 30 MG 24 hr tablet  Need for prophylactic vaccination against Streptococcus pneumoniae (pneumococcus) - Plan: Pneumococcal conjugate vaccine 13-valent  Need for prophylactic vaccination and inoculation against influenza - Plan: Flu vaccine HIGH DOSE PF (Fluzone Tri High dose)  Osteoporosis - Plan: CBC with Differential, Comprehensive metabolic panel  Vitamin B12 deficiency - Plan: Vitamin B12, CBC with Differential, Comprehensive metabolic panel

## 2014-04-07 LAB — CBC WITH DIFFERENTIAL/PLATELET
BASOS ABS: 0 10*3/uL (ref 0.0–0.1)
BASOS PCT: 0 % (ref 0–1)
EOS PCT: 1 % (ref 0–5)
Eosinophils Absolute: 0 10*3/uL (ref 0.0–0.7)
HEMATOCRIT: 36.2 % (ref 36.0–46.0)
HEMOGLOBIN: 12 g/dL (ref 12.0–15.0)
LYMPHS PCT: 26 % (ref 12–46)
Lymphs Abs: 1 10*3/uL (ref 0.7–4.0)
MCH: 29.1 pg (ref 26.0–34.0)
MCHC: 33.1 g/dL (ref 30.0–36.0)
MCV: 87.7 fL (ref 78.0–100.0)
MONO ABS: 0.6 10*3/uL (ref 0.1–1.0)
MONOS PCT: 15 % — AB (ref 3–12)
NEUTROS ABS: 2.2 10*3/uL (ref 1.7–7.7)
Neutrophils Relative %: 58 % (ref 43–77)
Platelets: 217 10*3/uL (ref 150–400)
RBC: 4.13 MIL/uL (ref 3.87–5.11)
RDW: 15.1 % (ref 11.5–15.5)
WBC: 3.8 10*3/uL — ABNORMAL LOW (ref 4.0–10.5)

## 2014-04-07 LAB — COMPREHENSIVE METABOLIC PANEL
ALBUMIN: 3.6 g/dL (ref 3.5–5.2)
ALT: 12 U/L (ref 0–35)
AST: 19 U/L (ref 0–37)
Alkaline Phosphatase: 74 U/L (ref 39–117)
BUN: 16 mg/dL (ref 6–23)
CALCIUM: 8.6 mg/dL (ref 8.4–10.5)
CHLORIDE: 107 meq/L (ref 96–112)
CO2: 22 mEq/L (ref 19–32)
CREATININE: 0.69 mg/dL (ref 0.50–1.10)
GLUCOSE: 161 mg/dL — AB (ref 70–99)
POTASSIUM: 3.9 meq/L (ref 3.5–5.3)
Sodium: 140 mEq/L (ref 135–145)
Total Bilirubin: 0.4 mg/dL (ref 0.2–1.2)
Total Protein: 6.4 g/dL (ref 6.0–8.3)

## 2014-04-07 LAB — VITAMIN B12: VITAMIN B 12: 219 pg/mL (ref 211–911)

## 2014-04-07 LAB — HEMOGLOBIN A1C
HEMOGLOBIN A1C: 6 % — AB (ref <5.7)
MEAN PLASMA GLUCOSE: 126 mg/dL — AB (ref <117)

## 2014-07-17 ENCOUNTER — Ambulatory Visit (INDEPENDENT_AMBULATORY_CARE_PROVIDER_SITE_OTHER): Payer: Medicare Other | Admitting: Family Medicine

## 2014-07-17 ENCOUNTER — Other Ambulatory Visit: Payer: Self-pay

## 2014-07-17 ENCOUNTER — Encounter: Payer: Self-pay | Admitting: Family Medicine

## 2014-07-17 DIAGNOSIS — Z87448 Personal history of other diseases of urinary system: Secondary | ICD-10-CM

## 2014-07-17 DIAGNOSIS — Z87898 Personal history of other specified conditions: Secondary | ICD-10-CM

## 2014-07-17 DIAGNOSIS — R339 Retention of urine, unspecified: Secondary | ICD-10-CM | POA: Diagnosis not present

## 2014-07-17 LAB — POCT URINALYSIS DIPSTICK
Bilirubin, UA: NEGATIVE
Blood, UA: NEGATIVE
Glucose, UA: NEGATIVE
Ketones, UA: NEGATIVE
Leukocytes, UA: NEGATIVE
Nitrite, UA: NEGATIVE
Protein, UA: NEGATIVE
Spec Grav, UA: 1.025
Urobilinogen, UA: NEGATIVE
pH, UA: 6

## 2014-07-17 MED ORDER — INCONTINENCE BRIEF MEDIUM MISC: 1.0000 | Status: DC | PRN

## 2014-07-17 NOTE — Progress Notes (Signed)
   Subjective:    Patient ID: Ashley Fisher, female    DOB: 1915/06/17, 81 y.o.   MRN: [id number]  HPI She is brought in by her relatives for concerns of possible bladder infection. They do not think that she is urinating enough and are concerned about an infection. She does not complain of any urgency, dysuria or frequency.   Review of Systems     Objective:   Physical Exam Alert and in no distress. Abdominal exam shows no masses or tenderness. Urinalysis does show a specific gravity of 1.025 otherwise negative       Assessment & Plan:  Urine retention - Plan: POCT Urinalysis Dipstick  History of oliguria  recommend that she drink more fluids but and no intervention other than this is necessary

## 2014-08-08 ENCOUNTER — Other Ambulatory Visit: Payer: Self-pay

## 2014-08-08 ENCOUNTER — Telehealth: Payer: Self-pay | Admitting: Family Medicine

## 2014-08-08 NOTE — Telephone Encounter (Signed)
Pt's granddaughter, [name], called to find out where disposable diapers were sent and I told her the [facility]. She called right back stating that [facility] told her that they did not have the script on file and if they did they would not be able to file it to her insurance. [name] requesting that this be sent to a place that will accept pt's insurance.

## 2014-08-08 NOTE — Telephone Encounter (Signed)
Informed Ashley Fisher that she needed to call and talk with someone from medicare to see if they will cover supplies she said she would contact the case worker and go from there

## 2014-10-10 ENCOUNTER — Telehealth: Payer: Self-pay | Admitting: Family Medicine

## 2014-10-10 NOTE — Telephone Encounter (Signed)
Have her bring in a urine specimen

## 2014-10-10 NOTE — Telephone Encounter (Signed)
Told daughter kim that she should come by and bring urine in

## 2014-10-10 NOTE — Telephone Encounter (Signed)
Daughter [name] called states thinks pt has UTI, or something, states she won't urinate but maybe once a day, strong odor.  She is having mobility issues and very hard for her to get her in to doctor.  Can daughter just bring a urine specimen in for it to be checked?  Said next week she could have some help getting her here and could probably bring her in then but would like urine to be checked before then.

## 2014-10-11 ENCOUNTER — Other Ambulatory Visit (INDEPENDENT_AMBULATORY_CARE_PROVIDER_SITE_OTHER): Payer: Medicare Other

## 2014-10-11 DIAGNOSIS — R339 Retention of urine, unspecified: Secondary | ICD-10-CM

## 2014-10-11 LAB — POCT URINALYSIS DIPSTICK
BILIRUBIN UA: NEGATIVE
Blood, UA: NEGATIVE
GLUCOSE UA: NEGATIVE
Ketones, UA: NEGATIVE
NITRITE UA: NEGATIVE
Protein, UA: NEGATIVE
Spec Grav, UA: >=1.03
Urobilinogen, UA: NEGATIVE
pH, UA: 6

## 2014-10-24 NOTE — Progress Notes (Signed)
   Subjective:    Patient ID: Ashley Fisher, female    DOB: 02-21-1916, 81 y.o.   MRN: [id number]  HPI    Review of Systems     Objective:   Physical Exam        Assessment & Plan:  eror

## 2015-02-05 ENCOUNTER — Telehealth: Payer: Self-pay | Admitting: Family Medicine

## 2015-02-05 NOTE — Telephone Encounter (Signed)
Pt granddaughter come in and lost handicap placard and needs new completed put in your folder

## 2015-02-14 NOTE — Telephone Encounter (Signed)
Left message informing parking placard is ready.  °

## 2015-02-19 ENCOUNTER — Encounter: Payer: Self-pay | Admitting: Family Medicine

## 2015-02-19 ENCOUNTER — Ambulatory Visit (INDEPENDENT_AMBULATORY_CARE_PROVIDER_SITE_OTHER): Payer: Medicare Other | Admitting: Family Medicine

## 2015-02-19 VITALS — BP 120/60 | HR 68

## 2015-02-19 DIAGNOSIS — L539 Erythematous condition, unspecified: Secondary | ICD-10-CM | POA: Diagnosis not present

## 2015-02-19 DIAGNOSIS — Z23 Encounter for immunization: Secondary | ICD-10-CM | POA: Diagnosis not present

## 2015-02-19 NOTE — Addendum Note (Signed)
Addended by: Herminio Commons A on: 02/19/2015 05:11 PM   Modules accepted: Orders

## 2015-02-19 NOTE — Progress Notes (Signed)
   Subjective:    Patient ID: Ashley Fisher, female    DOB: 08-13-1915, 81 y.o.   MRN: [id number]  HPI She is brought here by her caregiver, her granddaughter, for complaint of pain patient flinching and stating it hurt when her granddaughter wiped her on Saturday. She only complained one time and has not complained of pain since then. Granddaughter states patient only voids 1 time per day usually and will void at night as well. The patient has a history of oliguria and urinary retention. She wears adult briefs. She is sitting in a wheelchair. She denies pain. Caregiver and patient deny fever, chills, abdominal pain, vomiting, diarrhea.   Reviewed allergies, medications, past medical history.   Review of Systems Pertinent positives and negatives in the history of present illness.    Objective:   Physical Exam  Constitutional: Vital signs are normal. She appears cachectic.  Non-toxic appearance.  Abdominal: Soft. Normal appearance. There is no tenderness.  Genitourinary:    There is no rash, tenderness or lesion on the right labia. There is no rash, tenderness or lesion on the left labia.  Neurological: She is alert.         Assessment & Plan:  Redness of skin  There is no evidence of infection. Showed granddaughter the area of mild redness and discussed that she will keep an eye on this and let me know if it becomes more red, swollen, or hard. Also discussed that if she continues to complain of pain when wiping her to let us know. Discussed looking for any signs of infection including fever, chills, change in mental status. Emphasized to the granddaughter that she is doing a good job keeping her clean and dry.

## 2015-02-19 NOTE — Patient Instructions (Signed)
Exam didn't show anything concerning for infection. Let us know if she develops a sore her skin becomes redder, or if she develops fever, chills or signs of infection.

## 2015-04-08 ENCOUNTER — Other Ambulatory Visit: Payer: Self-pay | Admitting: Family Medicine

## 2015-04-09 NOTE — Telephone Encounter (Signed)
Is this okay?

## 2015-08-17 ENCOUNTER — Encounter: Payer: Self-pay | Admitting: *Deleted

## 2015-11-13 ENCOUNTER — Ambulatory Visit: Payer: Self-pay | Admitting: Family Medicine

## 2015-12-25 ENCOUNTER — Ambulatory Visit (INDEPENDENT_AMBULATORY_CARE_PROVIDER_SITE_OTHER): Payer: Medicare Other | Admitting: Family Medicine

## 2015-12-25 ENCOUNTER — Telehealth: Payer: Self-pay

## 2015-12-25 ENCOUNTER — Encounter: Payer: Self-pay | Admitting: Family Medicine

## 2015-12-25 VITALS — BP 130/70 | HR 74

## 2015-12-25 DIAGNOSIS — N390 Urinary tract infection, site not specified: Secondary | ICD-10-CM | POA: Diagnosis not present

## 2015-12-25 DIAGNOSIS — H5000 Unspecified esotropia: Secondary | ICD-10-CM

## 2015-12-25 LAB — POCT URINALYSIS DIPSTICK
Bilirubin, UA: NEGATIVE
Glucose, UA: NEGATIVE
Ketones, UA: NEGATIVE
Leukocytes, UA: NEGATIVE
Nitrite, UA: POSITIVE
PH UA: 6
Protein, UA: NEGATIVE
RBC UA: POSITIVE
Spec Grav, UA: >=1.03
Urobilinogen, UA: NEGATIVE

## 2015-12-25 MED ORDER — SULFAMETHOXAZOLE-TRIMETHOPRIM 200-40 MG/5ML PO SUSP: ORAL | Status: DC

## 2015-12-25 NOTE — Progress Notes (Signed)
   Subjective:    Patient ID: Ashley Fisher, female    DOB: 1915-10-05, 82 y.o.   MRN: [id number]  HPI She is here with her granddaughter who states that she has been sleeping a lot more than normal. She also notes drainage that is clear mainly from the left eye. No fever, chills, abdominal pain. Mentally other than sleeping she seems be doing well.   Review of Systems     Objective:   Physical Exam Alert and in no distress. Cardiac exam shows regular rhythm without murmurs or gallops. Lungs are clear to auscultation. Urine dipstick was positive for nitrites. Microscopic did show contamination with occasional red cell.       Assessment & Plan:  UTI (lower urinary tract infection) - Plan: POCT Urinalysis Dipstick, sulfamethoxazole-trimethoprim (BACTRIM,SEPTRA) 200-40 MG/5ML suspension  Esotropia of left eye I will place her on an antibiotic to see if this will help. Explained that at her age anything can tip her over the edge in terms of her mental functioning. Discussed the eye findings and apparently at this time the will consider an eye evaluation.

## 2015-12-25 NOTE — Telephone Encounter (Signed)
Talked with [name] and she [facility]

## 2015-12-26 NOTE — Telephone Encounter (Signed)
Send her to ophthalmology

## 2015-12-28 ENCOUNTER — Other Ambulatory Visit: Payer: Self-pay

## 2015-12-28 DIAGNOSIS — H5000 Unspecified esotropia: Secondary | ICD-10-CM

## 2015-12-28 NOTE — Telephone Encounter (Signed)
[facility]

## 2016-04-02 ENCOUNTER — Other Ambulatory Visit: Payer: Self-pay | Admitting: Family Medicine

## 2016-05-08 ENCOUNTER — Inpatient Hospital Stay (HOSPITAL_COMMUNITY)
Admission: EM | Admit: 2016-05-08 | Discharge: 2016-05-10 | DRG: 177 | Disposition: A | Discharge status: Home / Self Care | Payer: Medicare Other | Attending: Family Medicine | Admitting: Family Medicine

## 2016-05-08 ENCOUNTER — Encounter (HOSPITAL_COMMUNITY): Payer: Self-pay | Admitting: Pharmacy Technician

## 2016-05-08 ENCOUNTER — Emergency Department (HOSPITAL_COMMUNITY): Payer: Medicare Other

## 2016-05-08 DIAGNOSIS — M419 Scoliosis, unspecified: Secondary | ICD-10-CM | POA: Diagnosis present

## 2016-05-08 DIAGNOSIS — E785 Hyperlipidemia, unspecified: Secondary | ICD-10-CM | POA: Diagnosis present

## 2016-05-08 DIAGNOSIS — R400 Somnolence: Secondary | ICD-10-CM | POA: Diagnosis present

## 2016-05-08 DIAGNOSIS — E43 Unspecified severe protein-calorie malnutrition: Secondary | ICD-10-CM | POA: Diagnosis not present

## 2016-05-08 DIAGNOSIS — J690 Pneumonitis due to inhalation of food and vomit: Secondary | ICD-10-CM | POA: Diagnosis not present

## 2016-05-08 DIAGNOSIS — M810 Age-related osteoporosis without current pathological fracture: Secondary | ICD-10-CM | POA: Diagnosis present

## 2016-05-08 DIAGNOSIS — Z66 Do not resuscitate: Secondary | ICD-10-CM | POA: Diagnosis present

## 2016-05-08 DIAGNOSIS — Z79899 Other long term (current) drug therapy: Secondary | ICD-10-CM

## 2016-05-08 DIAGNOSIS — J189 Pneumonia, unspecified organism: Secondary | ICD-10-CM | POA: Diagnosis present

## 2016-05-08 DIAGNOSIS — Z681 Body mass index (BMI) 19 or less, adult: Secondary | ICD-10-CM

## 2016-05-08 DIAGNOSIS — G9340 Encephalopathy, unspecified: Secondary | ICD-10-CM | POA: Diagnosis present

## 2016-05-08 DIAGNOSIS — E870 Hyperosmolality and hypernatremia: Secondary | ICD-10-CM | POA: Diagnosis present

## 2016-05-08 DIAGNOSIS — J181 Lobar pneumonia, unspecified organism: Secondary | ICD-10-CM

## 2016-05-08 DIAGNOSIS — I10 Essential (primary) hypertension: Secondary | ICD-10-CM | POA: Diagnosis not present

## 2016-05-08 DIAGNOSIS — E860 Dehydration: Secondary | ICD-10-CM | POA: Diagnosis present

## 2016-05-08 DIAGNOSIS — L89122 Pressure ulcer of left upper back, stage 2: Secondary | ICD-10-CM | POA: Diagnosis present

## 2016-05-08 DIAGNOSIS — L89213 Pressure ulcer of right hip, stage 3: Secondary | ICD-10-CM | POA: Diagnosis present

## 2016-05-08 DIAGNOSIS — L89209 Pressure ulcer of unspecified hip, unspecified stage: Secondary | ICD-10-CM | POA: Diagnosis present

## 2016-05-08 LAB — COMPREHENSIVE METABOLIC PANEL
ALK PHOS: 75 U/L (ref 38–126)
ALT: 30 U/L (ref 14–54)
ANION GAP: 9 (ref 5–15)
AST: 30 U/L (ref 15–41)
Albumin: 2.4 g/dL — ABNORMAL LOW (ref 3.5–5.0)
BILIRUBIN TOTAL: 0.7 mg/dL (ref 0.3–1.2)
BUN: 36 mg/dL — ABNORMAL HIGH (ref 6–20)
CALCIUM: 8.3 mg/dL — AB (ref 8.9–10.3)
CO2: 23 mmol/L (ref 22–32)
Chloride: 115 mmol/L — ABNORMAL HIGH (ref 101–111)
Creatinine, Ser: 0.8 mg/dL (ref 0.44–1.00)
GFR, EST NON AFRICAN AMERICAN: 59 mL/min — AB (ref >60)
Glucose, Bld: 146 mg/dL — ABNORMAL HIGH (ref 65–99)
Potassium: 3.3 mmol/L — ABNORMAL LOW (ref 3.5–5.1)
SODIUM: 147 mmol/L — AB (ref 135–145)
TOTAL PROTEIN: 6 g/dL — AB (ref 6.5–8.1)

## 2016-05-08 LAB — CBC WITH DIFFERENTIAL/PLATELET
BASOS ABS: 0 10*3/uL (ref 0.0–0.1)
BASOS PCT: 0 %
EOS ABS: 0 10*3/uL (ref 0.0–0.7)
Eosinophils Relative: 0 %
HEMATOCRIT: 37.7 % (ref 36.0–46.0)
HEMOGLOBIN: 12.4 g/dL (ref 12.0–15.0)
Lymphocytes Relative: 5 %
Lymphs Abs: 0.6 10*3/uL — ABNORMAL LOW (ref 0.7–4.0)
MCH: 29.2 pg (ref 26.0–34.0)
MCHC: 32.9 g/dL (ref 30.0–36.0)
MCV: 88.9 fL (ref 78.0–100.0)
Monocytes Absolute: 0.5 10*3/uL (ref 0.1–1.0)
Monocytes Relative: 4 %
NEUTROS ABS: 9.9 10*3/uL — AB (ref 1.7–7.7)
NEUTROS PCT: 91 %
Platelets: 146 10*3/uL — ABNORMAL LOW (ref 150–400)
RBC: 4.24 MIL/uL (ref 3.87–5.11)
RDW: 15.3 % (ref 11.5–15.5)
WBC: 11 10*3/uL — AB (ref 4.0–10.5)

## 2016-05-08 LAB — I-STAT CG4 LACTIC ACID, ED: LACTIC ACID, VENOUS: 2.76 mmol/L — AB (ref 0.5–1.9)

## 2016-05-08 LAB — CBG MONITORING, ED: GLUCOSE-CAPILLARY: 134 mg/dL — AB (ref 65–99)

## 2016-05-08 MED ORDER — DEXTROSE 5 % IV SOLN
1.0000 g | INTRAVENOUS | Status: DC
  Administered 2016-05-08: 1 g via INTRAVENOUS
  Filled 2016-05-08 (×2): qty 10

## 2016-05-08 MED ORDER — SODIUM CHLORIDE 0.9 % IV BOLUS (SEPSIS)
1000.0000 mL | Freq: Once | INTRAVENOUS | Status: AC
  Administered 2016-05-08: 1000 mL via INTRAVENOUS

## 2016-05-08 MED ORDER — DEXTROSE 5 % IV SOLN
500.0000 mg | INTRAVENOUS | Status: DC
  Filled 2016-05-08: qty 500

## 2016-05-08 MED ORDER — DEXTROSE 5 % IV SOLN
500.0000 mg | Freq: Once | INTRAVENOUS | Status: AC
  Administered 2016-05-08: 500 mg via INTRAVENOUS
  Filled 2016-05-08: qty 500

## 2016-05-08 MED ORDER — ENOXAPARIN SODIUM 30 MG/0.3ML ~~LOC~~ SOLN
30.0000 mg | SUBCUTANEOUS | Status: DC
  Administered 2016-05-09 – 2016-05-10 (×2): 30 mg via SUBCUTANEOUS
  Filled 2016-05-08 (×3): qty 0.3

## 2016-05-08 MED ORDER — ENOXAPARIN SODIUM 40 MG/0.4ML ~~LOC~~ SOLN: 40.0000 mg | SUBCUTANEOUS | Status: DC

## 2016-05-08 MED ORDER — SODIUM CHLORIDE 0.9 % IV SOLN
INTRAVENOUS | Status: DC
  Administered 2016-05-08 – 2016-05-09 (×2): via INTRAVENOUS

## 2016-05-08 NOTE — ED Notes (Signed)
Phlebotomy unsuccessful at obtaining blood cultures. This RN unsuccessful at blood draw. Dr Julian ReilGardner notified and ordered to go ahead and start antibiotics.

## 2016-05-08 NOTE — ED Notes (Signed)
Elevated CG-4 reported to Dr. Goldston 

## 2016-05-08 NOTE — ED Notes (Signed)
X RAY at bedside 

## 2016-05-08 NOTE — ED Provider Notes (Signed)
[facility]-EMERGENCY DEPT Provider Note   CSN: [id number] Arrival date & time: 05/08/16  1659  LEVEL 5 CAVEAT - ALTERED MENTAL STATUS   History   Chief Complaint Chief Complaint  Patient presents with  . Altered Mental Status    HPI Ashley Fisher is a 82 y.o. female.  HPI  82 year old female brought from home by EMS with altered mental status. History taken from the granddaughter who is her caretaker and power of attorney. Patient has been having congestion for about one week. No cough but has had increased respiratory rate to the point the family was concerned she might have pneumonia. Low-grade fevers up to 99. Vomited once today and has had decreased appetite over the last couple days with decreased mental status over the last couple days.  Past Medical History:  Diagnosis Date  . Dyslipidemia   . Hypertension   . Osteoporosis   . Scoliosis   . Vitamin D deficiency     Patient Active Problem List   Diagnosis Date Noted  . Acute encephalopathy 05/08/2016  . Right lower lobe pneumonia (HCC) 05/08/2016  . Decubitus ulcer of hip 05/08/2016  . CAP (community acquired pneumonia) 05/08/2016  . History of tuberculosis 08/30/2012  . Osteoporosis 12/02/2010  . HTN (hypertension) 12/02/2010  . Scoliosis 12/02/2010  . Dyslipidemia 12/02/2010  . Vitamin B12 deficiency 12/02/2010    History reviewed. No pertinent surgical history.  OB History    No data available       Home Medications    Prior to Admission medications   Medication Sig Start Date End Date Taking? Authorizing Provider  cefdinir (OMNICEF) 250 MG/5ML suspension Take 250 mg by mouth 2 (two) times daily. Started 11/26, for 10 days ending 12/5   Yes Historical Provider, MD  Cyanocobalamin (B-12 DOTS SL) Place 3 drops under the tongue once a week.    Yes Historical Provider, MD  NIFEdipine (PROCARDIA-XL/ADALAT CC) 30 MG 24 hr tablet take 1 tablet by mouth once daily 04/02/16  Yes Ronnald Nian, MD  SSD 1 % cream  Apply 1 application topically 2 (two) times daily. 05/04/16  Yes Historical Provider, MD  sulfamethoxazole-trimethoprim Soyla Dryer) 200-40 MG/5ML suspension 2 teaspoons twice per day 12/25/15   Ronnald Nian, MD    Family History Family History  Problem Relation Age of Onset  . Stroke Mother   . Cancer Sister     Social History Social History  Substance Use Topics  . Smoking status: Never Smoker  . Smokeless tobacco: Never Used  . Alcohol use No     Allergies   Patient has no known allergies.   Review of Systems Review of Systems  Unable to perform ROS: Patient nonverbal     Physical Exam Updated Vital Signs BP 144/75   Pulse 86   Temp 98.4 F (36.9 C) (Rectal)   Resp 23   SpO2 99%   Physical Exam  Constitutional:  Cachectic, frail  HENT:  Head: Normocephalic and atraumatic.  Right Ear: External ear normal.  Left Ear: External ear normal.  Nose: Nose normal.  Eyes: Right eye exhibits no discharge. Left eye exhibits no discharge.  Cardiovascular: Normal rate, regular rhythm and normal heart sounds.   Pulmonary/Chest: Effort normal and breath sounds normal.  Abdominal: Soft. She exhibits no distension. There is no tenderness.  Neurological: She is alert.  Patient is nonverbal and does not follow commands when asked area however she is awake and appears to be tracking.  Skin: Skin is warm  and dry.  Superficial appearing right greater trochanter wound. Superficial appearing abrasion/wound to the left shoulder Small wound over the right lateral ankle of unclear depth  Nursing note and vitals reviewed.    ED Treatments / Results  Labs (all labs ordered are listed, but only abnormal results are displayed) Labs Reviewed  COMPREHENSIVE METABOLIC PANEL - Abnormal; Notable for the following:       Result Value   Sodium 147 (*)    Potassium 3.3 (*)    Chloride 115 (*)    Glucose, Bld 146 (*)    BUN 36 (*)    Calcium 8.3 (*)    Total Protein 6.0 (*)      Albumin 2.4 (*)    GFR calc non Af Amer 59 (*)    All other components within normal limits  CBC WITH DIFFERENTIAL/PLATELET - Abnormal; Notable for the following:    WBC 11.0 (*)    Platelets 146 (*)    Neutro Abs 9.9 (*)    Lymphs Abs 0.6 (*)    All other components within normal limits  I-STAT CG4 LACTIC ACID, ED - Abnormal; Notable for the following:    Lactic Acid, Venous 2.76 (*)    All other components within normal limits  CBG MONITORING, ED - Abnormal; Notable for the following:    Glucose-Capillary 134 (*)    All other components within normal limits  CULTURE, BLOOD (ROUTINE X 2)  CULTURE, BLOOD (ROUTINE X 2)  CULTURE, BLOOD (ROUTINE X 2)  CULTURE, BLOOD (ROUTINE X 2)  CULTURE, EXPECTORATED SPUTUM-ASSESSMENT  GRAM STAIN  URINALYSIS, ROUTINE W REFLEX MICROSCOPIC (NOT AT [facility])  BASIC METABOLIC PANEL  HIV ANTIBODY (ROUTINE TESTING)  STREP PNEUMONIAE URINARY ANTIGEN    EKG  EKG Interpretation  Date/Time:  Thursday May 08 2016 17:13:22 EST Ventricular Rate:  92 PR Interval:    QRS Duration: 102 QT Interval:  389 QTC Calculation: 482 R Axis:   25 Text Interpretation:  Unknown rhythm, irregular rate Short PR interval LVH with secondary repolarization abnormality Anterior infarct, old Confirmed by Ashad Fawbush MD, Shilynn Hoch [phone]([id number]) on 05/08/2016 5:21:22 PM       Radiology Ct Head Wo Contrast  Result Date: 05/08/2016 CLINICAL DATA:  82 year old female with altered mental status. EXAM: CT HEAD WITHOUT CONTRAST TECHNIQUE: Contiguous axial images were obtained from the base of the skull through the vertex without intravenous contrast. COMPARISON:  10/20/2012 and prior CTs FINDINGS: Brain: No evidence of acute infarction, hemorrhage, hydrocephalus, extra-axial collection or mass lesion/mass effect. Atrophy and chronic small-vessel white matter ischemic changes are again noted. Vascular: Intracranial atherosclerotic calcifications are present. Skull: Normal. Negative for  fracture or focal lesion. Sinuses/Orbits: No acute finding. Other: None. IMPRESSION: No evidence of acute intracranial abnormality. Atrophy and chronic small-vessel white matter ischemic changes. Electronically Signed   By: Harmon PierJeffrey  Hu M.D.   On: 05/08/2016 19:31   Dg Chest Portable 1 View  Result Date: 05/08/2016 CLINICAL DATA:  Altered mental status. EXAM: PORTABLE CHEST 1 VIEW COMPARISON:  08/22/2012 . FINDINGS: Mediastinum and hilar structures are normal. Bilateral pleural-parenchymal thickening again noted consistent with scarring. Mild right infrahilar and right base infiltrate noted. Close follow-up chest x-rays to demonstrate clearing suggested. Stable cardiomegaly. No pleural effusion pneumothorax. IMPRESSION: 1. Biapical pleural parenchymal thickening consistent with scarring. 2. Right infrahilar and right lower lobe mild infiltrate. Close follow-up chest x-rays recommended demonstrate clearing. 3. Cardiomegaly. Electronically Signed   By: Maisie Fushomas  Register   On: 05/08/2016 17:45    Procedures Procedures (  including critical care time)  Medications Ordered in ED Medications  azithromycin (ZITHROMAX) 500 mg in dextrose 5 % 250 mL IVPB (500 mg Intravenous New Bag/Given 05/08/16 2118)  cefTRIAXone (ROCEPHIN) 1 g in dextrose 5 % 50 mL IVPB (1 g Intravenous New Bag/Given 05/08/16 2029)  0.9 %  sodium chloride infusion ( Intravenous New Bag/Given 05/08/16 2029)  azithromycin (ZITHROMAX) 500 mg in dextrose 5 % 250 mL IVPB (not administered)  enoxaparin (LOVENOX) injection 40 mg (not administered)  sodium chloride 0.9 % bolus 1,000 mL (1,000 mLs Intravenous New Bag/Given 05/08/16 2000)     Initial Impression / Assessment and Plan / ED Course  I have reviewed the triage vital signs and the nursing notes.  Pertinent labs & imaging results that were available during my care of the patient were reviewed by me and considered in my medical decision making (see chart for details).  Clinical  Course     Patient found to have pneumonia. This is likely the cause of her change in mental status. Discussed with granddaughter who is POA, patient is a DO NOT RESUSCITATE. They would like fluids and antibiotics. At this point patient is stable except for depressed mental status but is currently protecting her airway. IV antibiotics and admit to the hospitalist.  Final Clinical Impressions(s) / ED Diagnoses   Final diagnoses:  Somnolence  Community acquired pneumonia of right lung, unspecified part of lung    New Prescriptions New Prescriptions   No medications on file     Pricilla LovelessScott Harriet Bollen, MD 05/08/16 2126

## 2016-05-08 NOTE — ED Notes (Signed)
This RN unsuccessful at drawing blood cultures. Phlebotomy to collect.

## 2016-05-08 NOTE — Progress Notes (Signed)
Pharmacy Antibiotic Note  Ashley Fisher is a 82 y.o. female admitted on 05/08/2016 with pneumonia.  Pharmacy has been consulted for ceftriaxone dosing.  Plan: Start ceftriaxone 1g IV Q24 Monitor clinical picture  Rx will sign off as no renal adjustments necessary     Temp (24hrs), Avg:98.4 F (36.9 C), Min:98.4 F (36.9 C), Max:98.4 F (36.9 C)  No results for input(s): WBC, CREATININE, LATICACIDVEN, VANCOTROUGH, VANCOPEAK, VANCORANDOM, GENTTROUGH, GENTPEAK, GENTRANDOM, TOBRATROUGH, TOBRAPEAK, TOBRARND, AMIKACINPEAK, AMIKACINTROU, AMIKACIN in the last 168 hours.  CrCl cannot be calculated (Patient's most recent lab result is older than the maximum 21 days allowed.).    No Known Allergies   Thank you for allowing pharmacy to be a part of this patient's care.  Ashley StammerBATCHELDER,Ashley Fisher J 05/08/2016 6:04 PM

## 2016-05-08 NOTE — ED Notes (Signed)
Pt transported to CT ?

## 2016-05-08 NOTE — H&P (Addendum)
History and Physical    Ashley Fisher ZOX:[id number] DOB: Dec 01, 1915 DOA: 05/08/2016   PCP: Carollee HerterLALONDE,JOHN CHARLES, MD Chief Complaint:  Chief Complaint  Patient presents with  . Altered Mental Status    HPI: Ashley FosterMary S Fisher is a 82 y.o. female with medical history significant of HTN.  Patient brought in to ED by family members due to AMS.  Patient not speaking much today which is new for her.  Patient has been having congestion for about one week.  No cough but has increased RR.  Low grade fevers to 99.  Vomited once today and has decreased appetite over past couple of days as well as decreased mental status.  Symptoms worsening, persistent, nothing makes better or worse.  ED Course: RLL PNA on CXR.  WBC 11k, slightly dehydrated with sodium 147, BUN 36, cr 0.8, initial lactate 2.7.  Review of Systems: Unable to perform due to AMS.   Past Medical History:  Diagnosis Date  . Dyslipidemia   . Hypertension   . Osteoporosis   . Scoliosis   . Vitamin D deficiency     History reviewed. No pertinent surgical history.   reports that she has never smoked. She has never used smokeless tobacco. She reports that she does not drink alcohol or use drugs.  No Known Allergies  Family History  Problem Relation Age of Onset  . Stroke Mother   . Cancer Sister       Prior to Admission medications   Medication Sig Start Date End Date Taking? Authorizing Provider  cefdinir (OMNICEF) 250 MG/5ML suspension Take 250 mg by mouth 2 (two) times daily. Started 11/26, for 10 days ending 12/5   Yes Historical Provider, MD  Cyanocobalamin (B-12 DOTS SL) Place 3 drops under the tongue once a week.    Yes Historical Provider, MD  NIFEdipine (PROCARDIA-XL/ADALAT CC) 30 MG 24 hr tablet take 1 tablet by mouth once daily 04/02/16  Yes Ronnald NianJohn C Lalonde, MD  SSD 1 % cream Apply 1 application topically 2 (two) times daily. 05/04/16  Yes Historical Provider, MD  sulfamethoxazole-trimethoprim (BACTRIM,SEPTRA) 200-40  MG/5ML suspension 2 teaspoons twice per day 12/25/15   Ronnald NianJohn C Lalonde, MD    Physical Exam: Vitals:   05/08/16 1713 05/08/16 1813 05/08/16 1815 05/08/16 1828  BP: 174/93  153/78   Pulse: 90 91  89  Resp: (!) 28 17 17    Temp: 98.4 F (36.9 C)     TempSrc: Rectal     SpO2: 100% 99%  99%      Constitutional: NAD, calm, comfortable Eyes: PERRL, lids and conjunctivae normal ENMT: Mucous membranes are moist. Posterior pharynx clear of any exudate or lesions.Normal dentition.  Neck: normal, supple, no masses, no thyromegaly Respiratory: clear to auscultation bilaterally, no wheezing, no crackles. Normal respiratory effort. No accessory muscle use.  Cardiovascular: Regular rate and rhythm, no murmurs / rubs / gallops. No extremity edema. 2+ pedal pulses. No carotid bruits.  Abdomen: no tenderness, no masses palpated. No hepatosplenomegaly. Bowel sounds positive.  Musculoskeletal: no clubbing / cyanosis. No joint deformity upper and lower extremities. Good ROM, no contractures. Normal muscle tone.  Skin: superficial R greater trochanter wound, small wound R lateral ankle Neurologic: Non verbal, not following commands, will track with eyes Psychiatric: Non verbal, not following commands, is tracking with eyes   Labs on Admission: I have personally reviewed following labs and imaging studies  CBC:  Recent Labs Lab 05/08/16 1751  WBC 11.0*  NEUTROABS 9.9*  HGB 12.4  HCT 37.7  MCV 88.9  PLT 146*   Basic Metabolic Panel:  Recent Labs Lab 05/08/16 1751  NA 147*  K 3.3*  CL 115*  CO2 23  GLUCOSE 146*  BUN 36*  CREATININE 0.80  CALCIUM 8.3*   GFR: CrCl cannot be calculated (Unknown ideal weight.). Liver Function Tests:  Recent Labs Lab 05/08/16 1751  AST 30  ALT 30  ALKPHOS 75  BILITOT 0.7  PROT 6.0*  ALBUMIN 2.4*   No results for input(s): LIPASE, AMYLASE in the last 168 hours. No results for input(s): AMMONIA in the last 168 hours. Coagulation Profile: No  results for input(s): INR, PROTIME in the last 168 hours. Cardiac Enzymes: No results for input(s): CKTOTAL, CKMB, CKMBINDEX, TROPONINI in the last 168 hours. BNP (last 3 results) No results for input(s): PROBNP in the last 8760 hours. HbA1C: No results for input(s): HGBA1C in the last 72 hours. CBG:  Recent Labs Lab 05/08/16 1723  GLUCAP 134*   Lipid Profile: No results for input(s): CHOL, HDL, LDLCALC, TRIG, CHOLHDL, LDLDIRECT in the last 72 hours. Thyroid Function Tests: No results for input(s): TSH, T4TOTAL, FREET4, T3FREE, THYROIDAB in the last 72 hours. Anemia Panel: No results for input(s): VITAMINB12, FOLATE, FERRITIN, TIBC, IRON, RETICCTPCT in the last 72 hours. Urine analysis:    Component Value Date/Time   COLORURINE YELLOW 10/20/2012 1612   APPEARANCEUR CLOUDY (A) 10/20/2012 1612   LABSPEC 1.020 10/20/2012 1612   PHURINE 5.5 10/20/2012 1612   GLUCOSEU NEGATIVE 10/20/2012 1612   HGBUR TRACE (A) 10/20/2012 1612   BILIRUBINUR n 12/25/2015 1220   KETONESUR NEGATIVE 10/20/2012 1612   PROTEINUR n 12/25/2015 1220   PROTEINUR NEGATIVE 10/20/2012 1612   UROBILINOGEN negative 12/25/2015 1220   UROBILINOGEN 0.2 10/20/2012 1612   NITRITE pos 12/25/2015 1220   NITRITE POSITIVE (A) 10/20/2012 1612   LEUKOCYTESUR Negative 12/25/2015 1220   Sepsis Labs: @LABRCNTIP (procalcitonin:4,lacticidven:4) )No results found for this or any previous visit (from the past 240 hour(s)).   Radiological Exams on Admission: Ct Head Wo Contrast  Result Date: 05/08/2016 CLINICAL DATA:  82 year old female with altered mental status. EXAM: CT HEAD WITHOUT CONTRAST TECHNIQUE: Contiguous axial images were obtained from the base of the skull through the vertex without intravenous contrast. COMPARISON:  10/20/2012 and prior CTs FINDINGS: Brain: No evidence of acute infarction, hemorrhage, hydrocephalus, extra-axial collection or mass lesion/mass effect. Atrophy and chronic small-vessel white matter  ischemic changes are again noted. Vascular: Intracranial atherosclerotic calcifications are present. Skull: Normal. Negative for fracture or focal lesion. Sinuses/Orbits: No acute finding. Other: None. IMPRESSION: No evidence of acute intracranial abnormality. Atrophy and chronic small-vessel white matter ischemic changes. Electronically Signed   By: Harmon PierJeffrey  Hu M.D.   On: 05/08/2016 19:31   Dg Chest Portable 1 View  Result Date: 05/08/2016 CLINICAL DATA:  Altered mental status. EXAM: PORTABLE CHEST 1 VIEW COMPARISON:  08/22/2012 . FINDINGS: Mediastinum and hilar structures are normal. Bilateral pleural-parenchymal thickening again noted consistent with scarring. Mild right infrahilar and right base infiltrate noted. Close follow-up chest x-rays to demonstrate clearing suggested. Stable cardiomegaly. No pleural effusion pneumothorax. IMPRESSION: 1. Biapical pleural parenchymal thickening consistent with scarring. 2. Right infrahilar and right lower lobe mild infiltrate. Close follow-up chest x-rays recommended demonstrate clearing. 3. Cardiomegaly. Electronically Signed   By: Maisie Fushomas  Register   On: 05/08/2016 17:45    EKG: Independently reviewed.  Assessment/Plan Principal Problem:   Right lower lobe pneumonia (HCC) Active Problems:   HTN (hypertension)   Acute encephalopathy  Decubitus ulcer of hip   CAP (community acquired pneumonia)    1. RLL PNA - 1. PNA pathway 2. IVF: 1L bolus in ED, 125 cc/hr for now due to dehydrated appearing BMP and decreased PO intake, repeat BMP in AM. 3. Rocephin, azithromycin 4. Cultures pending 2. Acute encephalopathy - 1. Likely secondary to #1 above 2. If no improvement with treatment of PNA and dehydration then would consider CNS imaging but CVA is lower on differential. 3. HTN - holding home CCB (was held this morning) due to not taking POs, currently SBP 150s 4. Decubitus of hip - wound care consult   DVT prophylaxis: Lovenox Code Status:  DNR/DNI and no pressors - confirmed by family Family Communication: Family at bedside Consults called: None Admission status: Admit to inpatient   Hillary Bow DO [facility] Pager [phone] from 7PM-7AM  If 7AM-7PM, please contact the day physician for the patient [email or web address] Password [email or web address]  05/08/2016, 8:05 PM

## 2016-05-08 NOTE — ED Notes (Signed)
Crystal RN and this RN in room to in and out cath patient. Family reported that patient was difficulty to catheterize. Crystal RN and this RN unsuccessful at in and out cath attempt. Dr Criss AlvineGoldston notified of situation and ordered to hold off on cathing patient.

## 2016-05-08 NOTE — ED Triage Notes (Signed)
Pt presents to the ED via [facility] from home with reports of AMS. Per EMS pt has been nonverbal today which is not her baseline.  EMS reports pt is being treated with abx for a wound on her hip. EMS states pt family reports pt had a cough for several days and this morning pt became nonverbal and altered.

## 2016-05-09 DIAGNOSIS — G9340 Encephalopathy, unspecified: Secondary | ICD-10-CM

## 2016-05-09 DIAGNOSIS — I10 Essential (primary) hypertension: Secondary | ICD-10-CM

## 2016-05-09 DIAGNOSIS — L89209 Pressure ulcer of unspecified hip, unspecified stage: Secondary | ICD-10-CM

## 2016-05-09 DIAGNOSIS — J690 Pneumonitis due to inhalation of food and vomit: Principal | ICD-10-CM

## 2016-05-09 LAB — BASIC METABOLIC PANEL
Anion gap: 12 (ref 5–15)
BUN: 33 mg/dL — AB (ref 6–20)
CALCIUM: 8.8 mg/dL — AB (ref 8.9–10.3)
CO2: 22 mmol/L (ref 22–32)
CREATININE: 0.77 mg/dL (ref 0.44–1.00)
Chloride: 114 mmol/L — ABNORMAL HIGH (ref 101–111)
GFR calc Af Amer: >60 mL/min (ref >60)
GLUCOSE: 129 mg/dL — AB (ref 65–99)
POTASSIUM: 3.3 mmol/L — AB (ref 3.5–5.1)
SODIUM: 148 mmol/L — AB (ref 135–145)

## 2016-05-09 LAB — LACTIC ACID, PLASMA: Lactic Acid, Venous: 2.6 mmol/L (ref 0.5–1.9)

## 2016-05-09 MED ORDER — ENSURE ENLIVE PO LIQD
237.0000 mL | Freq: Two times a day (BID) | ORAL | Status: DC
  Administered 2016-05-09 – 2016-05-10 (×2): 237 mL via ORAL

## 2016-05-09 MED ORDER — POTASSIUM CHLORIDE CRYS ER 20 MEQ PO TBCR
20.0000 meq | EXTENDED_RELEASE_TABLET | Freq: Once | ORAL | Status: AC
  Administered 2016-05-09: 20 meq via ORAL
  Filled 2016-05-09: qty 1

## 2016-05-09 MED ORDER — HYDROCORTISONE 1 % EX CREA
TOPICAL_CREAM | Freq: Two times a day (BID) | CUTANEOUS | Status: DC
  Administered 2016-05-09 – 2016-05-10 (×3): via TOPICAL
  Filled 2016-05-09: qty 28

## 2016-05-09 MED ORDER — SODIUM CHLORIDE 0.9 % IV SOLN
1.5000 g | Freq: Two times a day (BID) | INTRAVENOUS | Status: DC
  Filled 2016-05-09: qty 1.5

## 2016-05-09 MED ORDER — COLLAGENASE 250 UNIT/GM EX OINT
TOPICAL_OINTMENT | Freq: Every day | CUTANEOUS | Status: DC
  Administered 2016-05-09 – 2016-05-10 (×2): via TOPICAL
  Filled 2016-05-09: qty 30

## 2016-05-09 MED ORDER — AMOXICILLIN-POT CLAVULANATE 875-125 MG PO TABS
1.0000 | ORAL_TABLET | Freq: Two times a day (BID) | ORAL | Status: DC
  Administered 2016-05-09 – 2016-05-10 (×3): 1 via ORAL
  Filled 2016-05-09 (×3): qty 1

## 2016-05-09 MED ORDER — SODIUM CHLORIDE 0.45 % IV SOLN: INTRAVENOUS | Status: DC

## 2016-05-09 NOTE — Progress Notes (Signed)
IV team nurses notified me that they had stuck patient 4 times without IV success. Notified MD of this and he stated OK for patient to not have an IV and to encourage oral fluids. Granddaughter at patient's bedside and notified of this and verbalized understanding.

## 2016-05-09 NOTE — Progress Notes (Signed)
Pharmacy Antibiotic Note  Ashley Fisher is a 82 y.o. female admitted on 05/08/2016 with possible aspiration pneumonia.  Pharmacy has been consulted for Unasyn (ampicillin/sulbactam) dosing.  Plan: Start Unasyn (ampicillin/sulbactam) 1.5 grams IV q12h Monitor clinical progress, c/s, renal function, abx plan/LOT   Height: 5\' 9"  (175.3 cm) Weight: 80 lb 6.4 oz (36.5 kg) IBW/kg (Calculated) : 66.2  Temp (24hrs), Avg:98.1 F (36.7 C), Min:97.6 F (36.4 C), Max:98.4 F (36.9 C)   Recent Labs Lab 05/08/16 1751 05/08/16 1759 05/09/16 0051  WBC 11.0*  --   --   CREATININE 0.80  --  0.77  LATICACIDVEN  --  2.76*  --     Estimated Creatinine Clearance: 22.6 mL/min (by C-G formula based on SCr of 0.77 mg/dL).    No Known Allergies  Antimicrobials this admission: 11/30 Rocephin >> 11/30 11/30 Azithromycin>> 12/1 Unasyn >>   Dose adjustments this admission:  Microbiology results: 12/1 BCx: sent 12/1 Sputum: needs to be collected   Thank you for allowing us to participate in this patients care. Signe Coltonya C Hunner Garcon, PharmD Pager: [phone] 05/09/2016 11:17 AM

## 2016-05-09 NOTE — Progress Notes (Signed)
Initial Nutrition Assessment  DOCUMENTATION CODES:   Severe malnutrition in context of chronic illness, Underweight  INTERVENTION:  Provide Ensure Enlive po BID, each supplement provides 350 kcal and 20 grams of protein.  Encourage adequate PO intake.   NUTRITION DIAGNOSIS:   Malnutrition related to chronic illness as evidenced by severe depletion of body fat, severe depletion of muscle mass.  GOAL:   Patient will meet greater than or equal to 90% of their needs  MONITOR:   PO intake, Supplement acceptance, Labs, Weight trends, Skin, I & O's  REASON FOR ASSESSMENT:   Consult Wound healing  ASSESSMENT:   82 y.o. female with medical history significant of HTN.  Patient brought in to ED by family members due to AMS.  Patient not speaking much today which is new for her.  Patient has been having congestion for about one week.  No cough but has increased RR.  Low grade fevers to 99.  Vomited once and has decreased appetite over past couple of days as well as decreased mental status. RLL PNA on CXR  Pt was asleep during time of visit and did not awake to RD arousal attempts. No family at bedside. RD unable to obtain nutrition history. Meal completion this AM 50%. Pt with multiple pressure injuries. RD to order nutritional supplements to aid in caloric and protein needs as well as in wound healing.   Nutrition-Focused physical exam completed. Findings are severe fat depletion, severe muscle depletion, and moderate edema.   Labs and medications reviewed.   Diet Order:  Diet Heart Room service appropriate? Yes; Fluid consistency: Thin  Skin:  Wound (see comment) (Multiple pressure injuries to hip, L shoulder)  Last BM:  Unknown  Height:   Ht Readings from Last 1 Encounters:  05/09/16 5\' 9"  (1.753 m)    Weight:   Wt Readings from Last 1 Encounters:  05/09/16 80 lb 6.4 oz (36.5 kg)    Ideal Body Weight:  65.9 kg  BMI:  Body mass index is 11.87 kg/m.  Estimated  Nutritional Needs:   Kcal:  1300-1600  Protein:  55-65 grams  Fluid:  >/= 1.5 L/day  EDUCATION NEEDS:   No education needs identified at this time  Roslyn SmilingStephanie Raegan Winders, MS, RD, LDN Pager # [phone] After hours/ weekend pager # [phone]

## 2016-05-09 NOTE — Progress Notes (Signed)
Patient arrived to floor from ED via stretcher with family at bedside. Patient alert but nonverbal, will nod at times. Patient to be further assessed.

## 2016-05-09 NOTE — Progress Notes (Addendum)
PROGRESS NOTE  Ashley Fisher  ZOX:[id number] DOB: 08-Feb-1916 DOA: 05/08/2016 PCP: Carollee Herter, MD   Brief Narrative: Ashley Fisher is a 82 y.o. female with a history of HTN on only one medication brought by her granddaughter after an episode of vomiting in the setting of altered mental status. She reported Ashley Fisher had been eating/drinking less and occasionally choking. More recently she developed cough, low grade fever, and a single episode of posttussive emesis on day of admission. She'd also been much less verbal than usual. On arrival the patient was nonverbal but alert and CXR showed RLL infiltrate. WBC 11k and Na was 147 consistent with dehydration. Initial lactate was 2.7. She was admitted for IV fluids, ceftriaxone and azithromycin. Her mental status has improved with fluids but lactate remains elevated.   Assessment & Plan: Principal Problem:   Right lower lobe pneumonia (HCC) Active Problems:   HTN (hypertension)   Acute encephalopathy   Decubitus ulcer of hip   CAP (community acquired pneumonia)  Right lower lobe pneumonia: In setting of reported dysphagia by caretaker/granddaughter and emesis, highly suspicious for aspiration pneumonia. Repeat lactate unchanged, elevated.  - Change CAP abx to unasyn for aspiration pneumonia and monitor closely - Continue IVF's, change to 1/2NS at 3/4 maintenance (eating ok when fed by graddaughter) - Blood cultures 11/30 pending - UStrep Ag pending - Sputum culture uncollected - SLP evaluation  Dehydration: As evidenced by hypernatremia:  - Change IVF's to 1/2NS, monitor BMP - Continue to encourage po (requires feeds for full feeding).   Acute encephalopathy: Improved with fluids, still nonverbal with unfamiliar faces and not at baseline per granddaughter. CT head with chronic microvascular changes and no CVA. Suspicion for CVA remains low. - Suspect due to dehydration and infection - Continue to monitor as infection is  treated  HTN:  - Holding home CCB (her only medication) - Monitor  Right hip decubitus ulcer: Does not appear infected.  - WOC consult - Offloading/turn q2h - PT/OT consult  DVT prophylaxis: Lovenox Code Status: DNR/DNI/no pressors confirmed at admission Family Communication: Discussed at length with granddaughter/caretaker at bedside Disposition Plan: Inpatient management of PNA with IV abx, IV fluids  Consultants:   WOC  Procedures:   None  Antimicrobials:  Ceftriaxone 11/30 >> 12/1  Azithromycin 11/30 >> 12/1  Unasyn 12/1 >>   Subjective: Pt nonverbal, but alert. Granddaughter reports she's oriented and much more bright-eyed today. Has been choking for the past week intermittently, developed cough but no fevers.   Objective: Vitals:   05/08/16 2200 05/08/16 2336 05/09/16 0030 05/09/16 0546  BP: 172/100 135/84 140/87 (!) 165/77  Pulse:  94 95 91  Resp: (!) 28 22 (!) 24 (!) 28  Temp:   97.6 F (36.4 C) 98.3 F (36.8 C)  TempSrc:   Axillary Axillary  SpO2:  98% 95% 100%  Weight:   36.5 kg (80 lb 6.4 oz)   Height:   5\' 9"  (1.753 m)     Intake/Output Summary (Last 24 hours) at 05/09/16 1223 Last data filed at 05/09/16 0900  Gross per 24 hour  Intake          1353.34 ml  Output                0 ml  Net          1353.34 ml   Filed Weights   05/09/16 0030  Weight: 36.5 kg (80 lb 6.4 oz)    Examination: General exam: Elderly, frail  female in no distress  Respiratory system: Non-labored breathing room air. Diminished right breath sounds base to midlung zone. Cardiovascular system: Regular rate and rhythm. No murmur, rub, or gallop. No JVD, and no pedal edema. Gastrointestinal system: Abdomen scaphoid, soft, non-tender, non-distended, with normoactive bowel sounds. No organomegaly or masses felt. Central nervous system: Alert, nonverbal. Symmetric smile. Does not cooperative with exam. No focal neurological deficits noted on limited exam.  Extremities:  Warm, R > L leg contracted with diffuse wasting. Right arm contracted, edematous diffusely, nontender. AC fossa with erythematous induration sparing skin folds. Skin: As above Psychiatry: Unable to determine.  Data Reviewed: I have personally reviewed following labs and imaging studies  CBC:  Recent Labs Lab 05/08/16 1751  WBC 11.0*  NEUTROABS 9.9*  HGB 12.4  HCT 37.7  MCV 88.9  PLT 146*   Basic Metabolic Panel:  Recent Labs Lab 05/08/16 1751 05/09/16 0051  NA 147* 148*  K 3.3* 3.3*  CL 115* 114*  CO2 23 22  GLUCOSE 146* 129*  BUN 36* 33*  CREATININE 0.80 0.77  CALCIUM 8.3* 8.8*   GFR: Estimated Creatinine Clearance: 22.6 mL/min (by C-G formula based on SCr of 0.77 mg/dL). Liver Function Tests:  Recent Labs Lab 05/08/16 1751  AST 30  ALT 30  ALKPHOS 75  BILITOT 0.7  PROT 6.0*  ALBUMIN 2.4*   No results for input(s): LIPASE, AMYLASE in the last 168 hours. No results for input(s): AMMONIA in the last 168 hours. Coagulation Profile: No results for input(s): INR, PROTIME in the last 168 hours. Cardiac Enzymes: No results for input(s): CKTOTAL, CKMB, CKMBINDEX, TROPONINI in the last 168 hours. BNP (last 3 results) No results for input(s): PROBNP in the last 8760 hours. HbA1C: No results for input(s): HGBA1C in the last 72 hours. CBG:  Recent Labs Lab 05/08/16 1723  GLUCAP 134*   Lipid Profile: No results for input(s): CHOL, HDL, LDLCALC, TRIG, CHOLHDL, LDLDIRECT in the last 72 hours. Thyroid Function Tests: No results for input(s): TSH, T4TOTAL, FREET4, T3FREE, THYROIDAB in the last 72 hours. Anemia Panel: No results for input(s): VITAMINB12, FOLATE, FERRITIN, TIBC, IRON, RETICCTPCT in the last 72 hours. Urine analysis:    Component Value Date/Time   COLORURINE YELLOW 10/20/2012 1612   APPEARANCEUR CLOUDY (A) 10/20/2012 1612   LABSPEC 1.020 10/20/2012 1612   PHURINE 5.5 10/20/2012 1612   GLUCOSEU NEGATIVE 10/20/2012 1612   HGBUR TRACE (A)  10/20/2012 1612   BILIRUBINUR n 12/25/2015 1220   KETONESUR NEGATIVE 10/20/2012 1612   PROTEINUR n 12/25/2015 1220   PROTEINUR NEGATIVE 10/20/2012 1612   UROBILINOGEN negative 12/25/2015 1220   UROBILINOGEN 0.2 10/20/2012 1612   NITRITE pos 12/25/2015 1220   NITRITE POSITIVE (A) 10/20/2012 1612   LEUKOCYTESUR Negative 12/25/2015 1220   Sepsis Labs: @LABRCNTIP (procalcitonin:4,lacticidven:4)  ) Recent Results (from the past 240 hour(s))  Culture, blood (routine x 2)     Status: None (Preliminary result)   Collection Time: 05/09/16 12:36 AM  Result Value Ref Range Status   Specimen Description BLOOD LEFT ARM  Final   Special Requests IN PEDIATRIC BOTTLE 1CC  Final   Culture NO GROWTH < 12 HOURS  Final   Report Status PENDING  Incomplete  Culture, blood (routine x 2)     Status: None (Preliminary result)   Collection Time: 05/09/16 12:50 AM  Result Value Ref Range Status   Specimen Description BLOOD LEFT HAND  Final   Special Requests IN PEDIATRIC BOTTLE 1CC  Final   Culture NO  GROWTH < 12 HOURS  Final   Report Status PENDING  Incomplete     Radiology Studies: Ct Head Wo Contrast  Result Date: 05/08/2016 CLINICAL DATA:  82 year old female with altered mental status. EXAM: CT HEAD WITHOUT CONTRAST TECHNIQUE: Contiguous axial images were obtained from the base of the skull through the vertex without intravenous contrast. COMPARISON:  10/20/2012 and prior CTs FINDINGS: Brain: No evidence of acute infarction, hemorrhage, hydrocephalus, extra-axial collection or mass lesion/mass effect. Atrophy and chronic small-vessel white matter ischemic changes are again noted. Vascular: Intracranial atherosclerotic calcifications are present. Skull: Normal. Negative for fracture or focal lesion. Sinuses/Orbits: No acute finding. Other: None. IMPRESSION: No evidence of acute intracranial abnormality. Atrophy and chronic small-vessel white matter ischemic changes. Electronically Signed   By: Harmon PierJeffrey  Hu  M.D.   On: 05/08/2016 19:31   Dg Chest Portable 1 View  Result Date: 05/08/2016 CLINICAL DATA:  Altered mental status. EXAM: PORTABLE CHEST 1 VIEW COMPARISON:  08/22/2012 . FINDINGS: Mediastinum and hilar structures are normal. Bilateral pleural-parenchymal thickening again noted consistent with scarring. Mild right infrahilar and right base infiltrate noted. Close follow-up chest x-rays to demonstrate clearing suggested. Stable cardiomegaly. No pleural effusion pneumothorax. IMPRESSION: 1. Biapical pleural parenchymal thickening consistent with scarring. 2. Right infrahilar and right lower lobe mild infiltrate. Close follow-up chest x-rays recommended demonstrate clearing. 3. Cardiomegaly. Electronically Signed   By: Maisie Fushomas  Register   On: 05/08/2016 17:45    Scheduled Meds: . ampicillin-sulbactam (UNASYN) IV  1.5 g Intravenous Q12H  . enoxaparin (LOVENOX) injection  30 mg Subcutaneous Q24H  . hydrocortisone cream   Topical BID  . potassium chloride  20 mEq Oral Once   Continuous Infusions: . sodium chloride       LOS: 1 day   Time spent: 25 minutes.  Hazeline Junkeryan Sumeet Geter, MD [facility] Pager [phone]  If 7PM-7AM, please contact night-coverage [email or web address] Password [email or web address] 05/09/2016, 12:23 PM

## 2016-05-09 NOTE — Care Management Note (Signed)
Case Management Note  Patient Details  Name: Ashley Fisher MRN: [id number] Date of Birth: [birth year]  Subjective/Objective:      Admitted with AMS, RLL PNA/ Sepsis from home with granddaughter [name]. [name] states pt is wheelchair bound and requires max assist with ADL's PTA. DME: wheelchair, walker.          [name] (Grandaughter)     [phone]       PCP: [name]  Action/Plan: Return to home when medically stable. CM to f/u with disposition needs.  Expected Discharge Date:                  Expected Discharge Plan:  Home w Home Health Services  In-House Referral:     Discharge planning Services  CM Consult  Post Acute Care Choice:    [facility] / Guardian [name](Grandaughter [name])  DME Arranged:    DME Agency:     HH Arranged:    HH Agency:     Status of Service:  In process, will continue to follow  If discussed at Long Length of Stay Meetings, dates discussed:    Additional Comments:  [name], [name], RN 05/09/2016, 10:12 AM

## 2016-05-09 NOTE — Consult Note (Signed)
WOC Nurse wound consult note Reason for Consult: pressure ulcer to hip Wound type: Stage 1 to left hip present on admission Stage 3 pressure ulcer to right hip present on admission Stage 2 pressure ulcer to left shoulder x 2 present on admission  Pressure Ulcer POA:  Stage 1 to left hip-YES Stage 3 pressure  Ulcer right hip-YES Stage 2 pressure ulcer to left shoulder-YES  Measurement: Stage 1 to left hip- 2.5cmx3cmx0.1cm Stage 3 pressure ulcer right hip-2.5cmx2.5cmx0.2cm Stage 2 pressure ulcer to left shoulder (proximal)-1cmx4cmx0.1cm Stage 2 pressure ulcer to left shoulder (distal)-2.5cmx2cmx0.1cm  Wound bed: Left hip wound- skin intact with nonblanchable erythema, no drainage Right hip wound- 100% yellow slough. Scant amount of sanguineous drainage with no odor Both Left shoulder wound are 100% pink tissue. Scant amount of sanguineous drainage with no odor Periwound:intact Dressing procedure/placement/frequency:  Apply bordered foam dressing to Left hip and left shoulder wounds. Change every 3 days and prn for soilage Right hip wound: apply santyl ointment 1/4inch thick, cover with saline moistened gauze, and cover with bordered foam dressing. Change daily.  Low air loss mattress for pressure redistribution RD nutritional evaluation to support wound healing  Durel SaltsKeatah Brooks FNP-C, [employer] student  Discussed POC with patient and bedside nurse.  Re consult if needed, will not follow at this time. Thanks  Enio Hornback M[employer] MSN, RN,CWOCN, CNS [phone])

## 2016-05-10 ENCOUNTER — Inpatient Hospital Stay (HOSPITAL_COMMUNITY): Payer: Medicare Other

## 2016-05-10 DIAGNOSIS — E43 Unspecified severe protein-calorie malnutrition: Secondary | ICD-10-CM

## 2016-05-10 LAB — BASIC METABOLIC PANEL
ANION GAP: 10 (ref 5–15)
BUN: 20 mg/dL (ref 6–20)
CALCIUM: 8.3 mg/dL — AB (ref 8.9–10.3)
CO2: 23 mmol/L (ref 22–32)
Chloride: 112 mmol/L — ABNORMAL HIGH (ref 101–111)
Creatinine, Ser: 0.59 mg/dL (ref 0.44–1.00)
GFR calc non Af Amer: >60 mL/min (ref >60)
GLUCOSE: 114 mg/dL — AB (ref 65–99)
POTASSIUM: 3.4 mmol/L — AB (ref 3.5–5.1)
Sodium: 145 mmol/L (ref 135–145)

## 2016-05-10 LAB — CBC
HEMATOCRIT: 31.5 % — AB (ref 36.0–46.0)
HEMOGLOBIN: 10.5 g/dL — AB (ref 12.0–15.0)
MCH: 29.4 pg (ref 26.0–34.0)
MCHC: 33.3 g/dL (ref 30.0–36.0)
MCV: 88.2 fL (ref 78.0–100.0)
Platelets: 157 10*3/uL (ref 150–400)
RBC: 3.57 MIL/uL — AB (ref 3.87–5.11)
RDW: 15.2 % (ref 11.5–15.5)
WBC: 10.5 10*3/uL (ref 4.0–10.5)

## 2016-05-10 LAB — LACTIC ACID, PLASMA: Lactic Acid, Venous: 1.6 mmol/L (ref 0.5–1.9)

## 2016-05-10 LAB — HIV ANTIBODY (ROUTINE TESTING W REFLEX): HIV Screen 4th Generation wRfx: NONREACTIVE

## 2016-05-10 MED ORDER — AMOXICILLIN-POT CLAVULANATE 875-125 MG PO TABS
1.0000 | ORAL_TABLET | Freq: Two times a day (BID) | ORAL | 0 refills | Status: AC
Start: 2016-05-10 — End: ?

## 2016-05-10 NOTE — Progress Notes (Signed)
MD paged about pts. BP 104/48

## 2016-05-10 NOTE — Evaluation (Signed)
Clinical/Bedside Swallow Evaluation Patient Details  Name: Ashley FosterMary S Fisher MRN: [id number] Date of Birth: 07/28/1915  Today's Date: 05/10/2016 Time: SLP Start Time (ACUTE ONLY): 0945 SLP Stop Time (ACUTE ONLY): 1010 SLP Time Calculation (min) (ACUTE ONLY): 25 min  Past Medical History:  Past Medical History:  Diagnosis Date  . Dyslipidemia   . Hypertension   . Osteoporosis   . Scoliosis   . Vitamin D deficiency    Past Surgical History: History reviewed. No pertinent surgical history. HPI:  Ashley HolmesMary S Fisher a 82 y.o.femalewith medical history significant of HTN. Patient brought in to ED by family members due to AMS. Patient not speaking much today which is new for her. Patient has been having congestion for about one week. No cough but has increased RR. Low grade fevers to 99. Vomited once today and has decreased appetite over past couple of days as well as decreased mental status. Symptoms worsening, persistent, nothing makes better or worse.  Most recent chest xray is showing RLL PNA.     Assessment / Plan / Recommendation Clinical Impression  Clinical swallowing evaluation was completed.  Oral mechanism exam was attempted and unable to be completed as the patient was unable to follow the commands except to protrude her tongue.  Cough was very weak.  The patient presented with oropharyngeal dysphagia characterized by delayed oral transit, delayed swallow trigger and decreased hyo-laryngeal excursion to palpation.  The patient had consistent delayed throat clear given thin liquids that the granddaughter reported as new.  Recommend downgrade diet to dysphagia 3 and contiue with thin liquids pending MBS to rule our aspiration.      Aspiration Risk  Moderate aspiration risk    Diet Recommendation   Dysphagia 3 with thin  Medication Administration: Crushed with puree    Other  Recommendations Oral Care Recommendations: Oral care BID   Follow up Recommendations        Frequency and  Duration min 2x/week  2 weeks     Swallow Study   General Date of Onset: 05/08/16 HPI: Ashley HolmesMary S Fisher a 82 y.o.femalewith medical history significant of HTN. Patient brought in to ED by family members due to AMS. Patient not speaking much today which is new for her. Patient has been having congestion for about one week. No cough but has increased RR. Low grade fevers to 99. Vomited once today and has decreased appetite over past couple of days as well as decreased mental status. Symptoms worsening, persistent, nothing makes better or worse.  Most recent chest xray is showing RLL PNA.   Type of Study: Bedside Swallow Evaluation Previous Swallow Assessment: None noted.   Diet Prior to this Study: Regular;Thin liquids Temperature Spikes Noted:  (1 low garde since admission.  ) History of Recent Intubation: No Behavior/Cognition: Alert;Requires cueing;Doesn't follow directions Oral Cavity Assessment: Within Functional Limits Oral Care Completed by SLP: No Oral Cavity - Dentition: Dentures, top;Missing dentition Self-Feeding Abilities: Needs assist Patient Positioning: Partially reclined Baseline Vocal Quality: Low vocal intensity (Very limited output.  ) Volitional Cough: Weak Volitional Swallow: Able to elicit    Oral/Motor/Sensory Function Overall Oral Motor/Sensory Function:  (pt not able to follow commands.  )   Ice Chips Ice chips: Not tested   Thin Liquid Thin Liquid: Impaired Presentation: Cup;Spoon;Straw Oral Phase Impairments: Reduced labial seal Oral Phase Functional Implications: Right anterior spillage;Prolonged oral transit Pharyngeal  Phase Impairments: Suspected delayed Swallow;Decreased hyoid-laryngeal movement;Throat Clearing - Immediate    Nectar Thick Nectar Thick Liquid: Not tested  Honey Thick Honey Thick Liquid: Not tested   Puree Puree: Impaired Presentation: Spoon Oral Phase Impairments: Impaired mastication Oral Phase Functional Implications:  Prolonged oral transit Pharyngeal Phase Impairments: Suspected delayed Swallow;Decreased hyoid-laryngeal movement   Solid   GO   Solid: Impaired Presentation: Spoon Oral Phase Impairments: Impaired mastication Oral Phase Functional Implications: Impaired mastication;Prolonged oral transit Pharyngeal Phase Impairments: Suspected delayed Swallow;Decreased hyoid-laryngeal movement       Dimas AguasMelissa Nakeia Calvi, MA, CCC-SLP Acute Rehab SLP [phone] Dimas AguasGoodman, Ashley Fisher 05/10/2016,10:22 AM

## 2016-05-10 NOTE — Evaluation (Signed)
Physical Therapy Evaluation Patient Details Name: Eulis FosterMary S Gardenhire MRN: [id number] DOB: 12-Apr-1916 Today's Date: 05/10/2016   History of Present Illness  pt presents with PNA.  pt with hx of HTN and multiple pressure ulcers.    Clinical Impression  Pt's granddaughter present for eval and discussed pt's normal routine at home.  Per granddaughter, she gets pt up in the morning to her W/C and pt sits in W/C throughout the day with pillows propping her up so she doesn't fall over.  Granddaughter indicates she has to leave pt home alone at times for work, but that it is only for 2-3 hrs at most.  Dicussed with granddaughter need for regular positioning changes to decrease pressure and skin breakdown.  Advised granddaughter that pt should not sit up in W/C all day and need for air overlay mattress at home for skin protection.  Also discussed having 24hr care for pt.  Pt seems to be at baseline level of function, no further PT needs at this time.      Follow Up Recommendations Supervision/Assistance - 24 hour (HHAide to A when daughter leaves pt.)    Equipment Recommendations  3in1 (PT);Hospital bed (Drop arm commode and hospital bed with air overlay.)    Recommendations for Other Services       Precautions / Restrictions Precautions Precautions: Fall Restrictions Weight Bearing Restrictions: No      Mobility  Bed Mobility Overal bed mobility: Needs Assistance Bed Mobility: Supine to Sit;Sit to Supine     Supine to sit: Total assist Sit to supine: Total assist   General bed mobility comments: Per granddaughter this is baseline for pt.    Transfers Overall transfer level: Needs assistance   Transfers: Lateral/Scoot Transfers          Lateral/Scoot Transfers: Total assist General transfer comment: Granddaughter able to perform dependent lateral scoot transfer without cues or A.    Ambulation/Gait                Stairs            Wheelchair Mobility    Modified  Rankin (Stroke Patients Only)       Balance Overall balance assessment: History of Falls;Needs assistance Sitting-balance support: Feet unsupported;No upper extremity supported Sitting balance-Leahy Scale: Zero Sitting balance - Comments: pt makes no effort to A with balance.                                       Pertinent Vitals/Pain Pain Assessment: Faces Faces Pain Scale: Hurts a little bit Pain Location: pt generally grimaces during mobility.  Unclear location of pain.   Pain Descriptors / Indicators: Grimacing Pain Intervention(s): Monitored during session;Repositioned    Home Living Family/patient expects to be discharged to:: Private residence Living Arrangements: Other relatives (Granddaughter) Available Help at Discharge: Family;Available PRN/intermittently (Granddaughter indicates she leaves pt alone for up to 2-3hrs) Type of Home: House Home Access: Stairs to enter   [employer] CorporationEntrance Stairs-Number of Steps: "few" Home Layout: One level Home Equipment: Wheelchair - [employer] Corporationmanual;Walker - 2 wheels      Prior Function Level of Independence: Needs assistance   Gait / Transfers Assistance Needed: Granddaughter performs dependent lateral scoot transfer to/from W/C.    ADL's / Homemaking Assistance Needed: Granddaughter performs all ADLs.          Hand Dominance        Extremity/Trunk Assessment  Upper Extremity Assessment: Generalized weakness (Globally weak and contracted.)           Lower Extremity Assessment: Generalized weakness (Globally weak and contracted.)      Cervical / Trunk Assessment: Kyphotic  Communication   Communication:  (Nonverbal)  Cognition Arousal/Alertness: Awake/alert Behavior During Therapy: Flat affect Overall Cognitive Status: History of cognitive impairments - at baseline                      General Comments General comments (skin integrity, edema, etc.): Grand daughter education about positioning and turning  every couple hours to decrease pressure.      Exercises     Assessment/Plan    PT Assessment Patent does not need any further PT services  PT Problem List            PT Treatment Interventions      PT Goals (Current goals can be found in the Care Plan section)  Acute Rehab PT Goals Patient Stated Goal: Per granddaughter to return to home.   PT Goal Formulation: All assessment and education complete, DC therapy    Frequency     Barriers to discharge        Co-evaluation               End of Session   Activity Tolerance: Patient tolerated treatment well Patient left: in bed;with call bell/phone within reach;with family/visitor present Nurse Communication: Mobility status         Time: [id number] PT Time Calculation (min) (ACUTE ONLY): 23 min   Charges:   PT Evaluation $PT Eval Moderate Complexity: 1 Procedure PT Treatments $Therapeutic Activity: 8-22 mins   PT G CodesSunny Schlein:        Darion Milewski F Menashe Kafer, [place]PT  [phone] 05/10/2016, 1:42 PM

## 2016-05-10 NOTE — Discharge Summary (Signed)
Physician Discharge Summary  Ashley Fisher ZOX:[id number] DOB: Jan 08, 1916 DOA: 05/08/2016  PCP: Carollee HerterLALONDE,JOHN CHARLES, MD  Admit date: 05/08/2016 Discharge date: 05/10/2016  Admitted From: Home Disposition: Home   Recommendations for Outpatient Follow-up:  1. Follow up with PCP in 1-2 weeks 2. Recommend follow up chest xray to ensure clearing of RLL infiltrate  Home Health: Aide Equipment/Devices: 3 in 1, hospital bed  Discharge Condition: Stable CODE STATUS: DNR Diet recommendation: Dysphagia 1 (puree) solids, thin liquid  Brief/Interim Summary: Ashley HolmesMary S Grayis a 82 y.o.female with a history of HTN on only one medication brought by her granddaughter after an episode of vomiting in the setting of altered mental status. She reported [name] had been eating/drinking less and occasionally choking. More recently she developed cough, low grade fever, and a single episode of posttussive emesis on day of admission. She'd also been much less verbal than usual. On arrival the patient was nonverbal but alert and CXR showed RLL infiltrate. WBC 11k and Na was 147 consistent with dehydration. Initial lactate was 2.7. She was admitted for IV fluids, ceftriaxone and azithromycin. Her mental status has improved with fluids but lactate remained elevated. Antibiotics were changed to augmentin for concern of aspiration pneumonia. SLP evaluation showed dysphagia and diet recommendation as well as lifestyle modifications were given. Her vital signs improved, mental status returned to baseline, and she was discharged in stable condition. PT evaluated the patient and recommended home health aid to assist granddaughter with care. DME was also ordered prior to discharge as above.   Discharge Diagnoses:  Principal Problem:   Right lower lobe pneumonia (HCC) Active Problems:   HTN (hypertension)   Acute encephalopathy   Decubitus ulcer of hip   CAP (community acquired pneumonia)   Protein-calorie malnutrition,  severe  Right lower lobe pneumonia: In setting of reported dysphagia by caretaker/granddaughter and emesis, highly suspicious for aspiration pneumonia. Repeat lactate unchanged, elevated.  - Changed CAP abx to augmentin with clearing of lactate and improving clinical status.  - Blood cultures 11/30 pending at discharge, negative.  - Sputum culture uncollected  Dehydration: As evidenced by hypernatremia which resolved with IVF's and po hydration. - Continue to encourage po (requires feeds for full feeding): Appetite picked up after treatments.   Acute encephalopathy: Improved with fluids, still nonverbal with unfamiliar faces and not at baseline per granddaughter. CT head with chronic microvascular changes and no CVA. Suspicion for CVA remains low. - Suspect due to dehydration and infection: Resolved  HTN:  - Holding home CCB (her only medication) - Monitor  Right hip decubitus ulcer: Does not appear infected.  - WOC consult - Offloading/turn q2h - PT recommended home health aid and DME as above that were ordered prior to discharge.  Discharge Instructions Discharge Instructions    Call MD for:  difficulty breathing, headache or visual disturbances    Complete by:  As directed    Call MD for:  persistant nausea and vomiting    Complete by:  As directed    Call MD for:  temperature >100.4    Complete by:  As directed    Discharge instructions    Complete by:  As directed    You were admitted for pneumonia that was due to aspiration. Treatment includes the following:  - Take augmentin twice daily until all pills are gone - Follow up with your PCP in the next 1 - 2 weeks - To avoid aspiration, the following are the recommendations by speech therapy:   Dysphagia  1 (Puree) solids;Thin liquid  Liquid Administration via: Cup;Straw  Medication Administration: Crushed with puree  Supervision: Full assist for feeding  Compensations: Slow rate;Small sips/bites;Follow solids with  liquid  Postural Changes: Remain semi-upright after after feeds/meals (Comment);Seated upright at 90 degrees   Increase activity slowly    Complete by:  As directed        Medication List    STOP taking these medications   cefdinir 250 MG/5ML suspension Commonly known as:  OMNICEF   sulfamethoxazole-trimethoprim 200-40 MG/5ML suspension Commonly known as:  BACTRIM,SEPTRA     TAKE these medications   amoxicillin-clavulanate 875-125 MG tablet Commonly known as:  AUGMENTIN Take 1 tablet by mouth every 12 (twelve) hours.   B-12 DOTS SL Place 3 drops under the tongue once a week.   NIFEdipine 30 MG 24 hr tablet Commonly known as:  PROCARDIA-XL/ADALAT CC take 1 tablet by mouth once daily   SSD 1 % cream Generic drug:  silver sulfADIAZINE Apply 1 application topically 2 (two) times daily.            Durable Medical Equipment        Start     Ordered   05/10/16 1459  For home use only DME Hospital bed  Once    Question Answer Comment  Patient has (list medical condition): decubitus ulcerations   Bed type Semi-electric   Support Surface: Low Air loss Mattress      05/10/16 1458   05/10/16 1454  For home use only DME 3 n 1  Once     05/10/16 1457      No Known Allergies  Consultations:  None  Procedures/Studies: Ct Head Wo Contrast  Result Date: 05/08/2016 CLINICAL DATA:  82 year old female with altered mental status. EXAM: CT HEAD WITHOUT CONTRAST TECHNIQUE: Contiguous axial images were obtained from the base of the skull through the vertex without intravenous contrast. COMPARISON:  10/20/2012 and prior CTs FINDINGS: Brain: No evidence of acute infarction, hemorrhage, hydrocephalus, extra-axial collection or mass lesion/mass effect. Atrophy and chronic small-vessel white matter ischemic changes are again noted. Vascular: Intracranial atherosclerotic calcifications are present. Skull: Normal. Negative for fracture or focal lesion. Sinuses/Orbits: No acute  finding. Other: None. IMPRESSION: No evidence of acute intracranial abnormality. Atrophy and chronic small-vessel white matter ischemic changes. Electronically Signed   By: Harmon PierJeffrey  Hu M.D.   On: 05/08/2016 19:31   Dg Chest Port 1 View  Result Date: 05/10/2016 CLINICAL DATA:  Right lower lobe pneumonia. EXAM: PORTABLE CHEST 1 VIEW COMPARISON:  May 08, 2016 FINDINGS: Worsening opacity in the right infrahilar region. Stable opacity in the left upper lobe. Possible left retrocardiac opacity, more prominent the interval. Increased interstitial markings, right greater than left may represent pulmonary venous congestion. No change in the cardiomediastinal silhouette. IMPRESSION: Worsening opacity in the medial right lower lobe with stable opacity in the left upper lobe. Possible worsening left retrocardiac opacity. Pulmonary venous congestion. Electronically Signed   By: Gerome Samavid  Williams III M.D   On: 05/10/2016 11:09   Dg Chest Portable 1 View  Result Date: 05/08/2016 CLINICAL DATA:  Altered mental status. EXAM: PORTABLE CHEST 1 VIEW COMPARISON:  08/22/2012 . FINDINGS: Mediastinum and hilar structures are normal. Bilateral pleural-parenchymal thickening again noted consistent with scarring. Mild right infrahilar and right base infiltrate noted. Close follow-up chest x-rays to demonstrate clearing suggested. Stable cardiomegaly. No pleural effusion pneumothorax. IMPRESSION: 1. Biapical pleural parenchymal thickening consistent with scarring. 2. Right infrahilar and right lower lobe mild infiltrate. Close follow-up chest  x-rays recommended demonstrate clearing. 3. Cardiomegaly. Electronically Signed   By: Maisie Fus  Register   On: 05/08/2016 17:45   Dg Swallowing Func-speech Pathology  Result Date: 05/10/2016 Objective Swallowing Evaluation: Type of Study: MBS-Modified Barium Swallow Study Patient Details Name: YERALDI FIDLER MRN: [id number] Date of Birth: 07-Jun-1916 Today's Date: 05/10/2016 Time: SLP Start Time  (ACUTE ONLY): 1145-SLP Stop Time (ACUTE ONLY): 1203 SLP Time Calculation (min) (ACUTE ONLY): 18 min Past Medical History: Past Medical History: Diagnosis Date . Dyslipidemia  . Hypertension  . Osteoporosis  . Scoliosis  . Vitamin D deficiency  Past Surgical History: No past surgical history on file. HPI: PRIM MORACE a 82 y.o.femalewith medical history significant of HTN. Patient brought in to ED by family members due to AMS. Patient not speaking much today which is new for her. Patient has been having congestion for about one week. No cough but has increased RR. Low grade fevers to 99. Vomited once today and has decreased appetite over past couple of days as well as decreased mental status. Symptoms worsening, persistent, nothing makes better or worse.  Most recent chest xray is showing RLL PNA.   Subjective: The patient was seen in radiology for MBS to rule out aspiration and determine safest diet recommendation.  Assessment / Plan / Recommendation CHL IP CLINICAL IMPRESSIONS 05/10/2016 Therapy Diagnosis Moderate oral phase dysphagia;Severe oral phase dysphagia;Mild pharyngeal phase dysphagia Clinical Impression MBS was completed using thin liquids via tsp, cup and straw, pureed material and dual textured solids.  The patient presented with a mod-severe oral and mild pharyngeal dysphagia.  The oral phase of the swallow was characterized by decreased bolus control/coordination and premature spillage across all textures.  The pharyngeal phase of the swallow was characterized by a delayed swallow trigger with all swallows triggering in the valleculae and mild base of tongue residue.  Given dual textured solids the patient had significant premature spill to the valleculae with no swallow triggered despite max cues.  The patient required a liquid wash to fully clear this material both orally and pharyngeally.  A thin liquid wash via straw sip was completed to facilitate clearance and at that point the patient  was noted to silently penetrate to the level of the vocal cords.  Esophageal sweep did not reveal overt issues.  Recommend a dysphagia 1 diet with thin liquids.  The patient MUST be sat totally upright for all intake.  ST will follow up for therapeutic diet tolerance and patient/family education.   Impact on safety and function Moderate aspiration risk   CHL IP TREATMENT RECOMMENDATION 05/10/2016 Treatment Recommendations Therapy as outlined in treatment plan below   Prognosis 05/10/2016 Prognosis for Safe Diet Advancement Guarded Barriers to Reach Goals Cognitive deficits Barriers/Prognosis Comment -- CHL IP DIET RECOMMENDATION 05/10/2016 SLP Diet Recommendations Dysphagia 1 (Puree) solids;Thin liquid Liquid Administration via Cup;Straw Medication Administration Crushed with puree Compensations Slow rate;Small sips/bites;Follow solids with liquid Postural Changes Remain semi-upright after after feeds/meals (Comment);Seated upright at 90 degrees   CHL IP OTHER RECOMMENDATIONS 05/10/2016 Recommended Consults -- Oral Care Recommendations Oral care BID;Oral care before and after PO Other Recommendations --   CHL IP FOLLOW UP RECOMMENDATIONS 05/10/2016 Follow up Recommendations (No Data)   CHL IP FREQUENCY AND DURATION 05/10/2016 Speech Therapy Frequency (ACUTE ONLY) min 2x/week Treatment Duration 2 weeks      CHL IP ORAL PHASE 05/10/2016 Oral Phase Impaired Oral - Pudding Teaspoon -- Oral - Pudding Cup -- Oral - Honey Teaspoon -- Oral - Honey Cup --  Oral - Nectar Teaspoon -- Oral - Nectar Cup -- Oral - Nectar Straw -- Oral - Thin Teaspoon Premature spillage;Decreased bolus cohesion Oral - Thin Cup Premature spillage;Decreased bolus cohesion Oral - Thin Straw Decreased bolus cohesion;Premature spillage Oral - Puree Weak lingual manipulation;Delayed oral transit;Decreased bolus cohesion;Premature spillage Oral - Mech Soft -- Oral - Regular -- Oral - Multi-Consistency Premature spillage;Decreased bolus cohesion;Delayed oral  transit;Weak lingual manipulation;Impaired mastication Oral - Pill -- Oral Phase - Comment --  CHL IP PHARYNGEAL PHASE 05/10/2016 Pharyngeal Phase Impaired Pharyngeal- Pudding Teaspoon -- Pharyngeal -- Pharyngeal- Pudding Cup -- Pharyngeal -- Pharyngeal- Honey Teaspoon -- Pharyngeal -- Pharyngeal- Honey Cup -- Pharyngeal -- Pharyngeal- Nectar Teaspoon -- Pharyngeal -- Pharyngeal- Nectar Cup -- Pharyngeal -- Pharyngeal- Nectar Straw -- Pharyngeal -- Pharyngeal- Thin Teaspoon Delayed swallow initiation-vallecula Pharyngeal -- Pharyngeal- Thin Cup Delayed swallow initiation-vallecula Pharyngeal -- Pharyngeal- Thin Straw Delayed swallow initiation-vallecula;Penetration/Aspiration during swallow Pharyngeal Material enters airway, CONTACTS cords and not ejected out Pharyngeal- Puree Delayed swallow initiation-vallecula Pharyngeal -- Pharyngeal- Mechanical Soft -- Pharyngeal -- Pharyngeal- Regular -- Pharyngeal -- Pharyngeal- Multi-consistency Delayed swallow initiation-vallecula Pharyngeal -- Pharyngeal- Pill -- Pharyngeal -- Pharyngeal Comment --  CHL IP CERVICAL ESOPHAGEAL PHASE 05/10/2016 Cervical Esophageal Phase WFL Pudding Teaspoon -- Pudding Cup -- Honey Teaspoon -- Honey Cup -- Nectar Teaspoon -- Nectar Cup -- Nectar Straw -- Thin Teaspoon -- Thin Cup -- Thin Straw -- Puree -- Mechanical Soft -- Regular -- Multi-consistency -- Pill -- Cervical Esophageal Comment -- No flowsheet data found. Dimas Aguas, MA, CCC-SLP Acute Rehab SLP [phone] Fleet Contras 05/10/2016, 12:15 PM               Subjective: Pt at baseline, verbal, interactive. Cough improving. No fevers or dyspnea.   Discharge Exam: Vitals:   05/09/16 2128 05/10/16 1328  BP: (!) 146/67 (!) 104/48  Pulse: 95 96  Resp: 18 18  Temp: 99.2 F (37.3 C) 97.7 F (36.5 C)   Vitals:   05/09/16 0546 05/09/16 1423 05/09/16 2128 05/10/16 1328  BP: (!) 165/77 (!) 151/63 (!) 146/67 (!) 104/48  Pulse: 91 (!) 114 95 96  Resp: (!) 28 18 18 18    Temp: 98.3 F (36.8 C) 98.3 F (36.8 C) 99.2 F (37.3 C) 97.7 F (36.5 C)  TempSrc: Axillary  Axillary Oral  SpO2: 100%  99% 98%  Weight:      Height:       General: Pt is alert, awake, not in acute distress Cardiovascular: RRR, S1/S2 +, no rubs, no gallops Respiratory: Nonlabored on room air, decreased right-sided sounds. No crackles, wheezes, or rhonchi Abdominal: Soft, NT, ND, bowel sounds + Extremities: Warm, R > L leg contracted with diffuse wasting. Right arm contracted, edematous diffusely, nontender. AC fossa with erythematous induration sparing skin folds.  The results of significant diagnostics from this hospitalization (including imaging, microbiology, ancillary and laboratory) are listed below for reference.    Microbiology: Recent Results (from the past 240 hour(s))  Culture, blood (routine x 2)     Status: None (Preliminary result)   Collection Time: 05/09/16 12:36 AM  Result Value Ref Range Status   Specimen Description BLOOD LEFT ARM  Final   Special Requests IN PEDIATRIC BOTTLE 1CC  Final   Culture NO GROWTH < 12 HOURS  Final   Report Status PENDING  Incomplete  Culture, blood (routine x 2)     Status: None (Preliminary result)   Collection Time: 05/09/16 12:50 AM  Result Value Ref Range Status  Specimen Description BLOOD LEFT HAND  Final   Special Requests IN PEDIATRIC BOTTLE 1CC  Final   Culture NO GROWTH < 12 HOURS  Final   Report Status PENDING  Incomplete     Labs: Basic Metabolic Panel:  Recent Labs Lab 05/08/16 1751 05/09/16 0051 05/10/16 0505  NA 147* 148* 145  K 3.3* 3.3* 3.4*  CL 115* 114* 112*  CO2 23 22 23   GLUCOSE 146* 129* 114*  BUN 36* 33* 20  CREATININE 0.80 0.77 0.59  CALCIUM 8.3* 8.8* 8.3*   Liver Function Tests:  Recent Labs Lab 05/08/16 1751  AST 30  ALT 30  ALKPHOS 75  BILITOT 0.7  PROT 6.0*  ALBUMIN 2.4*   CBC:  Recent Labs Lab 05/08/16 1751 05/10/16 0505  WBC 11.0* 10.5  NEUTROABS 9.9*  --   HGB 12.4  10.5*  HCT 37.7 31.5*  MCV 88.9 88.2  PLT 146* 157   CBG:  Recent Labs Lab 05/08/16 1723  GLUCAP 134*   Urinalysis    Component Value Date/Time   COLORURINE YELLOW 10/20/2012 1612   APPEARANCEUR CLOUDY (A) 10/20/2012 1612   LABSPEC 1.020 10/20/2012 1612   PHURINE 5.5 10/20/2012 1612   GLUCOSEU NEGATIVE 10/20/2012 1612   HGBUR TRACE (A) 10/20/2012 1612   BILIRUBINUR n 12/25/2015 1220   KETONESUR NEGATIVE 10/20/2012 1612   PROTEINUR n 12/25/2015 1220   PROTEINUR NEGATIVE 10/20/2012 1612   UROBILINOGEN negative 12/25/2015 1220   UROBILINOGEN 0.2 10/20/2012 1612   NITRITE pos 12/25/2015 1220   NITRITE POSITIVE (A) 10/20/2012 1612   LEUKOCYTESUR Negative 12/25/2015 1220   Microbiology Recent Results (from the past 240 hour(s))  Culture, blood (routine x 2)     Status: None (Preliminary result)   Collection Time: 05/09/16 12:36 AM  Result Value Ref Range Status   Specimen Description BLOOD LEFT ARM  Final   Special Requests IN PEDIATRIC BOTTLE 1CC  Final   Culture NO GROWTH < 12 HOURS  Final   Report Status PENDING  Incomplete  Culture, blood (routine x 2)     Status: None (Preliminary result)   Collection Time: 05/09/16 12:50 AM  Result Value Ref Range Status   Specimen Description BLOOD LEFT HAND  Final   Special Requests IN PEDIATRIC BOTTLE 1CC  Final   Culture NO GROWTH < 12 HOURS  Final   Report Status PENDING  Incomplete   Time coordinating discharge: Over 30 minutes  Hazeline Junker, MD  [facility] 05/10/2016, 3:12 PM Pager [phone]  If 7PM-7AM, please contact night-coverage [email or web address] Password [email or web address]

## 2016-05-10 NOTE — Progress Notes (Signed)
Modified Barium Swallow Progress Note  Patient Details  Name: Ashley Fisher MRN: [id number] Date of Birth: 1915/08/30  Today's Date: 05/10/2016  Modified Barium Swallow completed.  Full report located under Chart Review in the Imaging Section.  Brief recommendations include the following:  Clinical Impression  MBS was completed using thin liquids via tsp, cup and straw, pureed material and dual textured solids.  The patient presented with a mod-severe oral and mild pharyngeal dysphagia.  The oral phase of the swallow was characterized by decreased bolus control/coordination and premature spillage across all textures.  The pharyngeal phase of the swallow was characterized by a delayed swallow trigger with all swallows triggering in the valleculae and mild base of tongue residue.  Given dual textured solids the patient had significant premature spill to the valleculae with no swallow triggered despite max cues.  The patient required a liquid wash to fully clear this material both orally and pharyngeally.  A thin liquid wash via straw sip was completed to facilitate clearance and at that point the patient was noted to silently penetrate to the level of the vocal cords.  Esophageal sweep did not reveal overt issues.  Recommend a dysphagia 1 diet with thin liquids.  The patient MUST be sat totally upright for all intake.  ST will follow up for therapeutic diet tolerance and patient/family education.     Swallow Evaluation Recommendations       SLP Diet Recommendations: Dysphagia 1 (Puree) solids;Thin liquid   Liquid Administration via: Cup;Straw   Medication Administration: Crushed with puree   Supervision: Full assist for feeding   Compensations: Slow rate;Small sips/bites;Follow solids with liquid   Postural Changes: Remain semi-upright after after feeds/meals (Comment);Seated upright at 90 degrees   Oral Care Recommendations: Oral care BID;Oral care before and after PO       Dimas AguasMelissa  Thamar Holik, MA, CCC-SLP Acute Rehab SLP [phone] Dimas AguasGoodman, Cinda Hara N 05/10/2016,12:37 PM

## 2016-05-11 NOTE — Care Management Note (Signed)
Case Management Note  Patient Details  Name: Ashley Fisher MRN: [id number] Date of Birth: [birth year]  Subjective/Objective:                  PNA Action/Plan: Discharge planning Expected Discharge Date:  05/10/16              Expected Discharge Plan:  Home w Home Health Services  In-House Referral:     Discharge planning Services  CM Consult  Post Acute Care Choice:    Choice offered to:  [facility] / Guardian [name](Grandaughter [name])  DME Arranged:  Hospital bed DME Agency:  [facility]  [facility]     Status of Service:  In process, will continue to follow  If discussed at Long Length of Stay Meetings, dates discussed:    Additional Comments: CM received call from RN concerning after hours discharge of this pt to please arrange for a hospital bed.  CM has notified [facility] DME rep, Reggie to please arrange for delivery of hospital bed.  Cm has called home number and left voicemail (without identifiers per HIPAA)  to speak with pt or family member as this CM notes MD has placed order for Aide only which will not be paid for insurance as a stand alone discipline.  CM is waiting for callback. Yves DillJeffries, Ivo Moga Christine, RN 05/11/2016, 11:24 AM

## 2016-05-12 ENCOUNTER — Telehealth: Payer: Self-pay | Admitting: Family Medicine

## 2016-05-12 NOTE — Telephone Encounter (Signed)
Called and spoke to Micah FlesherGranddaughter [name], who is pt's care provider. She states pt is at home and doing better. She is eating good. All medications stayed the same with the exception of they added a antibiotic. Pt's granddaughter made an appt for hospital follow up and states that she would like to discuss home health at that time. She states she really needs assistance in caring for her. After hours protocol was discussed and she verbalized understanding.

## 2016-05-15 ENCOUNTER — Other Ambulatory Visit: Payer: Self-pay

## 2016-05-15 LAB — CULTURE, BLOOD (ROUTINE X 2)
CULTURE: NO GROWTH
Culture: NO GROWTH

## 2016-05-16 ENCOUNTER — Telehealth: Payer: Self-pay | Admitting: Family Medicine

## 2016-05-16 NOTE — Telephone Encounter (Signed)
See if you can arrange for home health nurse to go out and see how she is doing

## 2016-05-16 NOTE — Telephone Encounter (Signed)
Pt's grand daughter, [name], called asking for help regarding [facility] [name] said it has been a struggle for her to get pt out of the house for appointments and now after recent hospital stay & pnuemonia dx [name] says it is even more difficult for her to move pt around. Can Dr Susann GivensLalonde set pt up with home health so that she does not need to come in Thursday and maybe not need to leave the home much in the future?

## 2016-05-20 NOTE — Telephone Encounter (Signed)
[facility]

## 2016-05-21 NOTE — Progress Notes (Signed)
Clarification on pressure ulcer staging:   Right hip pressure ulcer POA was stage 3. Left shoulder pressure ulcer POA was stage 2.   Ashley Junkeryan Grunz, MD 05/21/2016 8:34 PM

## 2016-05-22 ENCOUNTER — Inpatient Hospital Stay: Payer: Self-pay | Admitting: Family Medicine

## 2016-05-23 ENCOUNTER — Telehealth: Payer: Self-pay

## 2016-05-23 NOTE — Telephone Encounter (Signed)
Pt's nurse, Verdon CumminsJesse called for wound care orders for 7 pressure ulcers. Reports that pt has unstageable pressure ulcers. Rt hip is largest and can not be staged d/t escar. Rt shoulder with escar. Lt shoulder with stage 1 and stage 2 ulcers, Left hip stage 2, left outside ankle moist, rt ankle stage 2. Verdon CumminsJesse would like to use silvadene ointment with dressings over the weekend, and teaching to family such as changing positions.   Verdon CumminsJesse also request ST, and SW to help with transportation and other needs of family.  Per Dr. Susann GivensLalonde- ok to provide order. Verbal given. Trixie Rude/RLB

## 2016-05-29 ENCOUNTER — Telehealth: Payer: Self-pay | Admitting: Family Medicine

## 2016-05-29 NOTE — Telephone Encounter (Signed)
Ashley CumminsJesse was informed JCL agrees with treatment

## 2016-05-29 NOTE — Telephone Encounter (Signed)
Ok

## 2016-05-29 NOTE — Telephone Encounter (Signed)
Verdon CumminsJesse, [facility], called requesting a verbal order to change wound dressing on pt's ankle. Wound on right ankle has progressed to a stage 3 & nurse would like to use a calcium alginate dressing on the ankle. Verdon CumminsJesse can be reached at [phone]

## 2016-05-30 ENCOUNTER — Telehealth: Payer: Self-pay

## 2016-05-30 NOTE — Telephone Encounter (Signed)
ST, Donnita FallsKim Pollock, she did eval on pt and would like to request verbal order for 3 sessions for dx of dysphagia. Please call [name] at [phone] to provide verbal orders. Trixie Rude/RLB

## 2016-05-30 NOTE — Telephone Encounter (Signed)
Okay 

## 2016-06-03 NOTE — Telephone Encounter (Signed)
[name] notified of verbal orders. Trixie Rude/RLB

## 2016-06-12 ENCOUNTER — Other Ambulatory Visit: Payer: Self-pay

## 2016-06-12 ENCOUNTER — Telehealth: Payer: Self-pay | Admitting: Family Medicine

## 2016-06-12 DIAGNOSIS — T07XXXA Unspecified multiple injuries, initial encounter: Secondary | ICD-10-CM

## 2016-06-12 NOTE — Telephone Encounter (Signed)
Kathie RhodesBetty informed okay for wound care also sent in referral for wound care

## 2016-06-12 NOTE — Telephone Encounter (Signed)
Ashley RhodesBetty from [facility] would like a verbal on 1 time a week for 4 weeks for wound care, and would also like a referral for the wound clinic states some of her wounds needs cleaning she can be reached at [phone]

## 2016-06-12 NOTE — Telephone Encounter (Signed)
ok 

## 2016-06-16 ENCOUNTER — Telehealth: Payer: Self-pay | Admitting: Family Medicine

## 2016-06-16 NOTE — Telephone Encounter (Signed)
Faxed order for adult diapers and under [facility] to [facility]

## 2016-06-17 ENCOUNTER — Telehealth: Payer: Self-pay | Admitting: Family Medicine

## 2016-06-17 NOTE — Telephone Encounter (Signed)
[facility] and she verified pt has several decubitus on shoulders, hips & ankles and is bladder and bowel incontinent.  [facility] & faxed

## 2016-07-10 DEATH — deceased
